# Patient Record
Sex: Female | Born: 1985 | Race: Black or African American | Hispanic: No | Marital: Single | Smoking: Current some day smoker
Health system: Southern US, Community
[De-identification: ages and names within clinical notes are randomized; demographics above are authoritative.]

## PROBLEM LIST (undated history)

## (undated) DIAGNOSIS — M549 Dorsalgia, unspecified: Secondary | ICD-10-CM

## (undated) DIAGNOSIS — G8929 Other chronic pain: Secondary | ICD-10-CM

## (undated) DIAGNOSIS — G809 Cerebral palsy, unspecified: Secondary | ICD-10-CM

## (undated) DIAGNOSIS — I10 Essential (primary) hypertension: Secondary | ICD-10-CM

---
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
## 2009-04-17 ENCOUNTER — Emergency Department: Payer: Self-pay | Admitting: Emergency Medicine

## 2009-10-19 ENCOUNTER — Ambulatory Visit: Payer: Self-pay

## 2009-10-20 ENCOUNTER — Inpatient Hospital Stay: Payer: Self-pay

## 2010-10-05 ENCOUNTER — Ambulatory Visit: Payer: Self-pay | Admitting: Family Medicine

## 2013-01-28 ENCOUNTER — Emergency Department: Payer: Self-pay | Admitting: Emergency Medicine

## 2015-04-20 ENCOUNTER — Encounter: Payer: Self-pay | Admitting: Emergency Medicine

## 2015-04-20 ENCOUNTER — Emergency Department
Admission: EM | Admit: 2015-04-20 | Discharge: 2015-04-20 | Disposition: A | Discharge status: Home / Self Care | Payer: Medicaid Other | Attending: Emergency Medicine | Admitting: Emergency Medicine

## 2015-04-20 DIAGNOSIS — I10 Essential (primary) hypertension: Secondary | ICD-10-CM | POA: Insufficient documentation

## 2015-04-20 DIAGNOSIS — R05 Cough: Secondary | ICD-10-CM

## 2015-04-20 DIAGNOSIS — R059 Cough, unspecified: Secondary | ICD-10-CM

## 2015-04-20 DIAGNOSIS — J069 Acute upper respiratory infection, unspecified: Secondary | ICD-10-CM | POA: Insufficient documentation

## 2015-04-20 DIAGNOSIS — Z79899 Other long term (current) drug therapy: Secondary | ICD-10-CM | POA: Diagnosis not present

## 2015-04-20 DIAGNOSIS — Z720 Tobacco use: Secondary | ICD-10-CM | POA: Insufficient documentation

## 2015-04-20 DIAGNOSIS — Z791 Long term (current) use of non-steroidal anti-inflammatories (NSAID): Secondary | ICD-10-CM | POA: Insufficient documentation

## 2015-04-20 DIAGNOSIS — G809 Cerebral palsy, unspecified: Secondary | ICD-10-CM | POA: Insufficient documentation

## 2015-04-20 HISTORY — DX: Dorsalgia, unspecified: M54.9

## 2015-04-20 HISTORY — DX: Other chronic pain: G89.29

## 2015-04-20 HISTORY — DX: Cerebral palsy, unspecified: G80.9

## 2015-04-20 HISTORY — DX: Essential (primary) hypertension: I10

## 2015-04-20 MED ORDER — ACETAMINOPHEN-CODEINE #3 300-30 MG PO TABS: 1.0000 | ORAL_TABLET | Freq: Four times a day (QID) | ORAL | Status: DC | PRN

## 2015-04-20 MED ORDER — PREDNISONE 10 MG PO TABS: 10.0000 mg | ORAL_TABLET | Freq: Two times a day (BID) | ORAL | Status: DC

## 2015-04-20 NOTE — ED Notes (Signed)
Pt presents with rhinorrhea and cough for three days. Denies fever. Pt complains of body aches. Pt is hoarse and losing voice. States she had the flu shot at the beginning of October.

## 2015-04-20 NOTE — Discharge Instructions (Signed)
Cough, Adult A cough helps to clear your throat and lungs. A cough may last only 2-3 weeks (acute), or it may last longer than 8 weeks (chronic). Many different things can cause a cough. A cough may be a sign of an illness or another medical condition. HOME CARE  Pay attention to any changes in your cough.  Take medicines only as told by your doctor.  If you were prescribed an antibiotic medicine, take it as told by your doctor. Do not stop taking it even if you start to feel better.  Talk with your doctor before you try using a cough medicine.  Drink enough fluid to keep your pee (urine) clear or pale yellow.  If the air is dry, use a cold steam vaporizer or humidifier in your home.  Stay away from things that make you cough at work or at home.  If your cough is worse at night, try using extra pillows to raise your head up higher while you sleep.  Do not smoke, and try not to be around smoke. If you need help quitting, ask your doctor.  Do not have caffeine.  Do not drink alcohol.  Rest as needed. GET HELP IF:  You have new problems (symptoms).  You cough up yellow fluid (pus).  Your cough does not get better after 2-3 weeks, or your cough gets worse.  Medicine does not help your cough and you are not sleeping well.  You have pain that gets worse or pain that is not helped with medicine.  You have a fever.  You are losing weight and you do not know why.  You have night sweats. GET HELP RIGHT AWAY IF:  You cough up blood.  You have trouble breathing.  Your heartbeat is very fast.   This information is not intended to replace advice given to you by your health care provider. Make sure you discuss any questions you have with your health care provider.   Document Released: 02/10/2011 Document Revised: 02/18/2015 Document Reviewed: 08/06/2014 Elsevier Interactive Patient Education 2016 [employer]   Continue to dose your daily allergy medicines. Take the  prescription meds as directed.  [facility] as needed for medical care.

## 2015-04-20 NOTE — ED Provider Notes (Signed)
[facility] Emergency Department Provider Note ____________________________________________  Time seen: 1555  I have reviewed the triage vital signs and the nursing notes.  HISTORY  Chief Complaint  Cough  HPI Dawn Frank is a 29 y.o. female reports to the ED for evaluation and treatment of a cough that she has apparently had for several weeks now. She was recently provided with a prescription for Tessalon Perles by her PCP, but denies any significant benefit with that medication. She reports an intermittently productive cough with rhinorrhea for the last 3 days. She is also noting intermittent hoarseness to her voice in the last few days. She had not had any interim fevers, chills, or sweats.  Past Medical History  Diagnosis Date  . Hypertension   . Chronic back pain     Patient Active Problem List   Diagnosis Date Noted  . Cerebral palsy (HCC) 04/20/2015    History reviewed. No pertinent past surgical history.  Current Outpatient Rx  Name  Route  Sig  Dispense  Refill  . Ascorbic Acid (VITAMIN C) 1000 MG tablet   Oral   Take 1,000 mg by mouth daily.         . cyclobenzaprine (FLEXERIL) 10 MG tablet   Oral   Take 10 mg by mouth at bedtime.         . gabapentin (NEURONTIN) 300 MG capsule   Oral   Take 900 mg by mouth 3 (three) times daily.         Marland Kitchen. lisinopril (PRINIVIL,ZESTRIL) 10 MG tablet   Oral   Take 10 mg by mouth daily.         . meloxicam (MOBIC) 15 MG tablet   Oral   Take 15 mg by mouth daily.         Marland Kitchen. acetaminophen-codeine (TYLENOL #3) 300-30 MG tablet   Oral   Take 1 tablet by mouth every 6 (six) hours as needed for moderate pain.   10 tablet   0   . predniSONE (DELTASONE) 10 MG tablet   Oral   Take 1 tablet (10 mg total) by mouth 2 (two) times daily with a meal.   10 tablet   0    Allergies Review of patient's allergies indicates no known allergies.  History reviewed. No pertinent family  history.  Social History Social History  Substance Use Topics  . Smoking status: Current Every Day Smoker  . Smokeless tobacco: None  . Alcohol Use: No   Review of Systems  Constitutional: Negative for fever. Eyes: Negative for visual changes. ENT: Negative for sore throat. Reports hoarseness Cardiovascular: Negative for chest pain. Respiratory: Negative for shortness of breath. Reports cough and congestion as above.  Gastrointestinal: Negative for abdominal pain, vomiting and diarrhea. Genitourinary: Negative for dysuria. Musculoskeletal: Negative for back pain. Skin: Negative for rash. Neurological: Negative for headaches, focal weakness or numbness. ____________________________________________  PHYSICAL EXAM:  VITAL SIGNS: ED Triage Vitals  Enc Vitals Group     BP 04/20/15 1329 160/94 mmHg     Pulse Rate 04/20/15 1329 88     Resp 04/20/15 1329 20     Temp 04/20/15 1329 98.3 F (36.8 C)     Temp Source 04/20/15 1329 Oral     SpO2 04/20/15 1329 100 %     Weight 04/20/15 1329 218 lb (98.884 kg)     Height 04/20/15 1329 5\' 2"  (1.575 m)     Head Cir --      Peak Flow --  Pain Score 04/20/15 1329 10     Pain Loc --      Pain Edu? --      Excl. in GC? --    Constitutional: Alert and oriented. Well appearing and in no distress. Head: Normocephalic and atraumatic.      Eyes: Conjunctivae are normal. PERRL. Normal extraocular movements      Ears: Canals clear. TMs intact bilaterally.   Nose: No congestion/rhinorrhea.   Mouth/Throat: Mucous membranes are moist.   Neck: Supple. No thyromegaly. Hematological/Lymphatic/Immunological: No cervical lymphadenopathy. Cardiovascular: Normal rate, regular rhythm.  Respiratory: Normal respiratory effort. No wheezes/rales/rhonchi. Gastrointestinal: Soft and nontender. No distention. Musculoskeletal: Nontender with normal range of motion in all extremities.  Neurologic:  Normal gait without ataxia. Normal speech and  language. No gross focal neurologic deficits are appreciated. Skin:  Skin is warm, dry and intact. No rash noted. Psychiatric: Mood and affect are normal. Patient exhibits appropriate insight and judgment. ____________________________________________  INITIAL IMPRESSION / ASSESSMENT AND PLAN / ED COURSE  Patient with URI symptoms with laryngitis. She will be discharged with a prescription for prednisone to dose twice daily as directed as well as acetaminophen with codeine as needed for cough suppression. She will follow with the primary care provider at [facility] clinic for ongoing symptom management. ____________________________________________  FINAL CLINICAL IMPRESSION(S) / ED DIAGNOSES  Final diagnoses:  Cough  URI (upper respiratory infection)      Lissa Hoard, PA-C 04/20/15 2347  Myrna Blazer, MD 04/25/15 [phone]

## 2015-04-20 NOTE — ED Notes (Signed)
Pt to ed with c/o cough, congestion, sore throat, bilat feet pain x 3 days.  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

## 2015-05-24 ENCOUNTER — Emergency Department
Admission: EM | Admit: 2015-05-24 | Discharge: 2015-05-24 | Disposition: A | Discharge status: Home / Self Care | Payer: Medicaid Other | Attending: Emergency Medicine | Admitting: Emergency Medicine

## 2015-05-24 ENCOUNTER — Encounter: Payer: Self-pay | Admitting: Emergency Medicine

## 2015-05-24 DIAGNOSIS — Z7952 Long term (current) use of systemic steroids: Secondary | ICD-10-CM | POA: Diagnosis not present

## 2015-05-24 DIAGNOSIS — Z79899 Other long term (current) drug therapy: Secondary | ICD-10-CM | POA: Diagnosis not present

## 2015-05-24 DIAGNOSIS — J069 Acute upper respiratory infection, unspecified: Secondary | ICD-10-CM | POA: Insufficient documentation

## 2015-05-24 DIAGNOSIS — I10 Essential (primary) hypertension: Secondary | ICD-10-CM | POA: Insufficient documentation

## 2015-05-24 DIAGNOSIS — Z791 Long term (current) use of non-steroidal anti-inflammatories (NSAID): Secondary | ICD-10-CM | POA: Insufficient documentation

## 2015-05-24 DIAGNOSIS — R05 Cough: Secondary | ICD-10-CM | POA: Diagnosis present

## 2015-05-24 DIAGNOSIS — F17210 Nicotine dependence, cigarettes, uncomplicated: Secondary | ICD-10-CM | POA: Diagnosis not present

## 2015-05-24 MED ORDER — GUAIFENESIN-CODEINE 100-10 MG/5ML PO SOLN: 10.0000 mL | ORAL | Status: DC | PRN

## 2015-05-24 MED ORDER — CHLORPHENIRAMINE MALEATE 4 MG PO TABS: 4.0000 mg | ORAL_TABLET | Freq: Two times a day (BID) | ORAL | Status: DC | PRN

## 2015-05-24 MED ORDER — AZITHROMYCIN 250 MG PO TABS
ORAL_TABLET | ORAL | Status: DC
Start: 2015-05-24 — End: 2021-11-04

## 2015-05-24 NOTE — ED Notes (Signed)
NAD noted at time of D/C. Pt denies questions or concerns. Pt ambulatory to the lobby at this time.  

## 2015-05-24 NOTE — Discharge Instructions (Signed)
Upper Respiratory Infection, Adult °Most upper respiratory infections (URIs) are a viral infection of the air passages leading to the lungs. A URI affects the nose, throat, and upper air passages. The most common type of URI is nasopharyngitis and is typically referred to as "the common cold." °URIs run their course and usually go away on their own. Most of the time, a URI does not require medical attention, but sometimes a bacterial infection in the upper airways can follow a viral infection. This is called a secondary infection. Sinus and middle ear infections are common types of secondary upper respiratory infections. °Bacterial pneumonia can also complicate a URI. A URI can worsen asthma and chronic obstructive pulmonary disease (COPD). Sometimes, these complications can require emergency medical care and may be life threatening.  °CAUSES °Almost all URIs are caused by viruses. A virus is a type of germ and can spread from one person to another.  °RISKS FACTORS °You may be at risk for a URI if:  °· You smoke.   °· You have chronic heart or lung disease. °· You have a weakened defense (immune) system.   °· You are very young or very old.   °· You have nasal allergies or asthma. °· You work in crowded or poorly ventilated areas. °· You work in health care facilities or schools. °SIGNS AND SYMPTOMS  °Symptoms typically develop 2-3 days after you come in contact with a cold virus. Most viral URIs last 7-10 days. However, viral URIs from the influenza virus (flu virus) can last 14-18 days and are typically more severe. Symptoms may include:  °· Runny or stuffy (congested) nose.   °· Sneezing.   °· Cough.   °· Sore throat.   °· Headache.   °· Fatigue.   °· Fever.   °· Loss of appetite.   °· Pain in your forehead, behind your eyes, and over your cheekbones (sinus pain). °· Muscle aches.   °DIAGNOSIS  °Your health care provider may diagnose a URI by: °· Physical exam. °· Tests to check that your symptoms are not due to  another condition such as: °· Strep throat. °· Sinusitis. °· Pneumonia. °· Asthma. °TREATMENT  °A URI goes away on its own with time. It cannot be cured with medicines, but medicines may be prescribed or recommended to relieve symptoms. Medicines may help: °· Reduce your fever. °· Reduce your cough. °· Relieve nasal congestion. °HOME CARE INSTRUCTIONS  °· Take medicines only as directed by your health care provider.   °· Gargle warm saltwater or take cough drops to comfort your throat as directed by your health care provider. °· Use a warm mist humidifier or inhale steam from a shower to increase air moisture. This may make it easier to breathe. °· Drink enough fluid to keep your urine clear or pale yellow.   °· Eat soups and other clear broths and maintain good nutrition.   °· Rest as needed.   °· Return to work when your temperature has returned to normal or as your health care provider advises. You may need to stay home longer to avoid infecting others. You can also use a face mask and careful hand washing to prevent spread of the virus. °· Increase the usage of your inhaler if you have asthma.   °· Do not use any tobacco products, including cigarettes, chewing tobacco, or electronic cigarettes. If you need help quitting, ask your health care provider. °PREVENTION  °The best way to protect yourself from getting a cold is to practice good hygiene.  °· Avoid oral or hand contact with people with cold   symptoms.   °· Wash your hands often if contact occurs.   °There is no clear evidence that vitamin C, vitamin E, echinacea, or exercise reduces the chance of developing a cold. However, it is always recommended to get plenty of rest, exercise, and practice good nutrition.  °SEEK MEDICAL CARE IF:  °· You are getting worse rather than better.   °· Your symptoms are not controlled by medicine.   °· You have chills. °· You have worsening shortness of breath. °· You have brown or red mucus. °· You have yellow or brown nasal  discharge. °· You have pain in your face, especially when you bend forward. °· You have a fever. °· You have swollen neck glands. °· You have pain while swallowing. °· You have white areas in the back of your throat. °SEEK IMMEDIATE MEDICAL CARE IF:  °· You have severe or persistent: °¨ Headache. °¨ Ear pain. °¨ Sinus pain. °¨ Chest pain. °· You have chronic lung disease and any of the following: °¨ Wheezing. °¨ Prolonged cough. °¨ Coughing up blood. °¨ A change in your usual mucus. °· You have a stiff neck. °· You have changes in your: °¨ Vision. °¨ Hearing. °¨ Thinking. °¨ Mood. °MAKE SURE YOU:  °· Understand these instructions. °· Will watch your condition. °· Will get help right away if you are not doing well or get worse. °  °This information is not intended to replace advice given to you by your health care provider. Make sure you discuss any questions you have with your health care provider. °  °Document Released: 11/23/2000 Document Revised: 10/14/2014 Document Reviewed: 09/04/2013 °Elsevier Interactive Patient Education ©2016 [employer] Inc. ° °Cough, Adult °Coughing is a reflex that clears your throat and your airways. Coughing helps to heal and protect your lungs. It is normal to cough occasionally, but a cough that happens with other symptoms or lasts a long time may be a sign of a condition that needs treatment. A cough may last only 2-3 weeks (acute), or it may last longer than 8 weeks (chronic). °CAUSES °Coughing is commonly caused by: °· Breathing in substances that irritate your lungs. °· A viral or bacterial respiratory infection. °· Allergies. °· Asthma. °· Postnasal drip. °· Smoking. °· Acid backing up from the stomach into the esophagus (gastroesophageal reflux). °· Certain medicines. °· Chronic lung problems, including COPD (or rarely, lung cancer). °· Other medical conditions such as heart failure. °HOME CARE INSTRUCTIONS  °Pay attention to any changes in your symptoms. Take these actions to  help with your discomfort: °· Take medicines only as told by your health care provider. °¨ If you were prescribed an antibiotic medicine, take it as told by your health care provider. Do not stop taking the antibiotic even if you start to feel better. °¨ Talk with your health care provider before you take a cough suppressant medicine. °· Drink enough fluid to keep your urine clear or pale yellow. °· If the air is dry, use a cold steam vaporizer or humidifier in your bedroom or your home to help loosen secretions. °· Avoid anything that causes you to cough at work or at home. °· If your cough is worse at night, try sleeping in a semi-upright position. °· Avoid cigarette smoke. If you smoke, quit smoking. If you need help quitting, ask your health care provider. °· Avoid caffeine. °· Avoid alcohol. °· Rest as needed. °SEEK MEDICAL CARE IF:  °· You have new symptoms. °· You cough up pus. °· Your cough   does not get better after 2-3 weeks, or your cough gets worse. °· You cannot control your cough with suppressant medicines and you are losing sleep. °· You develop pain that is getting worse or pain that is not controlled with pain medicines. °· You have a fever. °· You have unexplained weight loss. °· You have night sweats. °SEEK IMMEDIATE MEDICAL CARE IF: °· You cough up blood. °· You have difficulty breathing. °· Your heartbeat is very fast. °  °This information is not intended to replace advice given to you by your health care provider. Make sure you discuss any questions you have with your health care provider. °  °Document Released: 11/26/2010 Document Revised: 02/18/2015 Document Reviewed: 08/06/2014 °Elsevier Interactive Patient Education ©2016 Elsevier Inc. ° °

## 2015-05-24 NOTE — ED Notes (Signed)
Pt reports "deep coughs" for the last 2-3 days. Pt states she has been unable to sleep due to coughing and also reports HA. Presents with dry cough to ED.

## 2015-05-24 NOTE — ED Provider Notes (Signed)
[facility] Emergency Department Provider Note  ____________________________________________  Time seen: Approximately 10:44 AM  I have reviewed the triage vital signs and the nursing notes.   HISTORY  Chief Complaint Cough and Headache   HPI Meda KlinefelterShamila D Sudano is a 29 y.o. female who presents for evaluation of cough congestion and drainage. Patient states symptoms started 3 days ago unable supinate secondary to cough states that she woke up sweating this morning unsure of fever chills   Past Medical History  Diagnosis Date  . Hypertension   . Chronic back pain   . Cerebral palsy [facility](HCC)     Patient Active Problem List   Diagnosis Date Noted  . Cerebral palsy (HCC)     History reviewed. No pertinent past surgical history.  Current Outpatient Rx  Name  Route  Sig  Dispense  Refill  . acetaminophen-codeine (TYLENOL #3) 300-30 MG tablet   Oral   Take 1 tablet by mouth every 6 (six) hours as needed for moderate pain.   10 tablet   0   . Ascorbic Acid (VITAMIN C) 1000 MG tablet   Oral   Take 1,000 mg by mouth daily.         Marland Kitchen. azithromycin (ZITHROMAX Z-PAK) 250 MG tablet      Take 2 tablets (500 mg) on  Day 1,  followed by 1 tablet (250 mg) once daily on Days 2 through 5.   6 each   0   . chlorpheniramine (CHLOR-TRIMETON) 4 MG tablet   Oral   Take 1 tablet (4 mg total) by mouth 2 (two) times daily as needed for allergies or rhinitis.   30 tablet   0   . cyclobenzaprine (FLEXERIL) 10 MG tablet   Oral   Take 10 mg by mouth at bedtime.         . gabapentin (NEURONTIN) 300 MG capsule   Oral   Take 900 mg by mouth 3 (three) times daily.         Marland Kitchen. guaiFENesin-codeine 100-10 MG/5ML syrup   Oral   Take 10 mLs by mouth every 4 (four) hours as needed for cough.   180 mL   0   . lisinopril (PRINIVIL,ZESTRIL) 10 MG tablet   Oral   Take 10 mg by mouth daily.         . meloxicam (MOBIC) 15 MG tablet   Oral   Take 15 mg by mouth  daily.         . predniSONE (DELTASONE) 10 MG tablet   Oral   Take 1 tablet (10 mg total) by mouth 2 (two) times daily with a meal.   10 tablet   0     Allergies Review of patient's allergies indicates no known allergies.  History reviewed. No pertinent family history.  Social History Social History  Substance Use Topics  . Smoking status: Current Some Day Smoker    Types: Cigarettes  . Smokeless tobacco: Never Used  . Alcohol Use: No    Review of Systems Constitutional: No fever/chills Eyes: No visual changes. ENT: Positive sore throat secondary to coughing Cardiovascular: Denies chest pain. Respiratory: Denies shortness of breath. Positive for coughing Gastrointestinal: No abdominal pain.  No nausea, no vomiting.  No diarrhea.  No constipation. Genitourinary: Negative for dysuria. Musculoskeletal: Negative for back pain. Skin: Negative for rash. Neurological: Negative for headaches, focal weakness or numbness.  10-point ROS otherwise negative.  ____________________________________________   PHYSICAL EXAM:  VITAL SIGNS: ED Triage Vitals  Enc Vitals Group     BP 05/24/15 1018 132/102 mmHg     Pulse Rate 05/24/15 1018 87     Resp 05/24/15 1018 20     Temp 05/24/15 1018 98 F (36.7 C)     Temp Source 05/24/15 1018 Oral     SpO2 05/24/15 1018 99 %     Weight --      Height --      Head Cir --      Peak Flow --      Pain Score 05/24/15 1031 10     Pain Loc --      Pain Edu? --      Excl. in GC? --     Constitutional: Alert and oriented. Well appearing and in no acute distress. Eyes: Conjunctivae are normal. PERRL. EOMI. Head: Atraumatic. Nose: Positive for congestion/rhinnorhea. Mouth/Throat: Mucous membranes are moist.  Oropharynx non-erythematous. Neck: No stridor.   Cardiovascular: Normal rate, regular rhythm. Grossly normal heart sounds.  Good peripheral circulation. Respiratory: Normal respiratory effort.  No retractions. Lungs  CTAB. Musculoskeletal: No lower extremity tenderness nor edema.  No joint effusions. Neurologic:  Normal speech and language. No gross focal neurologic deficits are appreciated. No gait instability. Skin:  Skin is warm, dry and intact. No rash noted. Psychiatric: Mood and affect are normal. Speech and behavior are normal.  ____________________________________________   LABS (all labs ordered are listed, but only abnormal results are displayed)  Labs Reviewed - No data to display ____________________________________________   PROCEDURES  Procedure(s) performed: None  Critical Care performed: No  ____________________________________________   INITIAL IMPRESSION / ASSESSMENT AND PLAN / ED COURSE  Pertinent labs & imaging results that were available during my care of the patient were reviewed by me and considered in my medical decision making (see chart for details).  Acute URI. Rx given for Z-Pak, Robitussin-AC and chlorpheniramine for drainage. Patient to follow up PCP or return to the ER with any worsening symptomology. Patient voices no other emergency medical complaints at this visit. ____________________________________________   FINAL CLINICAL IMPRESSION(S) / ED DIAGNOSES  Final diagnoses:  URI, acute      Evangeline Dakin, PA-C 05/24/15 1054  Phineas Semen, MD 05/24/15 1329

## 2015-08-15 ENCOUNTER — Emergency Department
Admission: EM | Admit: 2015-08-15 | Discharge: 2015-08-15 | Disposition: A | Discharge status: Home / Self Care | Payer: Medicare Other | Attending: Student | Admitting: Student

## 2015-08-15 ENCOUNTER — Emergency Department: Payer: Medicare Other

## 2015-08-15 DIAGNOSIS — Z79899 Other long term (current) drug therapy: Secondary | ICD-10-CM | POA: Diagnosis not present

## 2015-08-15 DIAGNOSIS — M545 Low back pain, unspecified: Secondary | ICD-10-CM

## 2015-08-15 DIAGNOSIS — F17210 Nicotine dependence, cigarettes, uncomplicated: Secondary | ICD-10-CM | POA: Diagnosis not present

## 2015-08-15 DIAGNOSIS — I10 Essential (primary) hypertension: Secondary | ICD-10-CM | POA: Insufficient documentation

## 2015-08-15 DIAGNOSIS — J069 Acute upper respiratory infection, unspecified: Secondary | ICD-10-CM | POA: Insufficient documentation

## 2015-08-15 DIAGNOSIS — Z3202 Encounter for pregnancy test, result negative: Secondary | ICD-10-CM | POA: Diagnosis not present

## 2015-08-15 DIAGNOSIS — Z792 Long term (current) use of antibiotics: Secondary | ICD-10-CM | POA: Diagnosis not present

## 2015-08-15 LAB — URINALYSIS COMPLETE WITH MICROSCOPIC (ARMC ONLY)
BILIRUBIN URINE: NEGATIVE
GLUCOSE, UA: NEGATIVE mg/dL
Hgb urine dipstick: NEGATIVE
KETONES UR: NEGATIVE mg/dL
Leukocytes, UA: NEGATIVE
NITRITE: NEGATIVE
Protein, ur: NEGATIVE mg/dL
SPECIFIC GRAVITY, URINE: 1.017 (ref 1.005–1.030)
pH: 6 (ref 5.0–8.0)

## 2015-08-15 LAB — POCT PREGNANCY, URINE: Preg Test, Ur: NEGATIVE

## 2015-08-15 MED ORDER — NAPROXEN 500 MG PO TABS: 500.0000 mg | ORAL_TABLET | Freq: Two times a day (BID) | ORAL | Status: AC

## 2015-08-15 MED ORDER — KETOROLAC TROMETHAMINE 60 MG/2ML IM SOLN
60.0000 mg | Freq: Once | INTRAMUSCULAR | Status: AC
  Administered 2015-08-15: 60 mg via INTRAMUSCULAR
  Filled 2015-08-15: qty 2

## 2015-08-15 MED ORDER — GUAIFENESIN-CODEINE 100-10 MG/5ML PO SOLN: 10.0000 mL | ORAL | Status: DC | PRN

## 2015-08-15 MED ORDER — PSEUDOEPHEDRINE HCL 60 MG PO TABS: 60.0000 mg | ORAL_TABLET | ORAL | Status: DC | PRN

## 2015-08-15 MED ORDER — HYDROCODONE-ACETAMINOPHEN 5-325 MG PO TABS: 1.0000 | ORAL_TABLET | ORAL | Status: DC | PRN

## 2015-08-15 NOTE — Discharge Instructions (Signed)
Back Pain, Adult °Back pain is very common in adults. The cause of back pain is rarely dangerous and the pain often gets better over time. The cause of your back pain may not be known. Some common causes of back pain include: °· Strain of the muscles or ligaments supporting the spine. °· Wear and tear (degeneration) of the spinal disks. °· Arthritis. °· Direct injury to the back. °For many people, back pain may return. Since back pain is rarely dangerous, most people can learn to manage this condition on their own. °HOME CARE INSTRUCTIONS °Watch your back pain for any changes. The following actions may help to lessen any discomfort you are feeling: °· Remain active. It is stressful on your back to sit or stand in one place for long periods of time. Do not sit, drive, or stand in one place for more than 30 minutes at a time. Take short walks on even surfaces as soon as you are able. Try to increase the length of time you walk each day. °· Exercise regularly as directed by your health care provider. Exercise helps your back heal faster. It also helps avoid future injury by keeping your muscles strong and flexible. °· Do not stay in bed. Resting more than 1-2 days can delay your recovery. °· Pay attention to your body when you bend and lift. The most comfortable positions are those that put less stress on your recovering back. Always use proper lifting techniques, including: °· Bending your knees. °· Keeping the load close to your body. °· Avoiding twisting. °· Find a comfortable position to sleep. Use a firm mattress and lie on your side with your knees slightly bent. If you lie on your back, put a pillow under your knees. °· Avoid feeling anxious or stressed. Stress increases muscle tension and can worsen back pain. It is important to recognize when you are anxious or stressed and learn ways to manage it, such as with exercise. °· Take medicines only as directed by your health care provider. Over-the-counter  medicines to reduce pain and inflammation are often the most helpful. Your health care provider may prescribe muscle relaxant drugs. These medicines help dull your pain so you can more quickly return to your normal activities and healthy exercise. °· Apply ice to the injured area: °· Put ice in a plastic bag. °· Place a towel between your skin and the bag. °· Leave the ice on for 20 minutes, 2-3 times a day for the first 2-3 days. After that, ice and heat may be alternated to reduce pain and spasms. °· Maintain a healthy weight. Excess weight puts extra stress on your back and makes it difficult to maintain good posture. °SEEK MEDICAL CARE IF: °· You have pain that is not relieved with rest or medicine. °· You have increasing pain going down into the legs or buttocks. °· You have pain that does not improve in one week. °· You have night pain. °· You lose weight. °· You have a fever or chills. °SEEK IMMEDIATE MEDICAL CARE IF:  °· You develop new bowel or bladder control problems. °· You have unusual weakness or numbness in your arms or legs. °· You develop nausea or vomiting. °· You develop abdominal pain. °· You feel faint. °  °This information is not intended to replace advice given to you by your health care provider. Make sure you discuss any questions you have with your health care provider. °  °Document Released: 05/30/2005 Document Revised: 06/20/2014 Document Reviewed: 10/01/2013 °Elsevier Interactive Patient Education ©2016 Elsevier   Inc.  Chronic Back Pain  When back pain lasts longer than 3 months, it is called chronic back pain.People with chronic back pain often go through certain periods that are more intense (flare-ups).  CAUSES Chronic back pain can be caused by wear and tear (degeneration) on different structures in your back. These structures include:  The bones of your spine (vertebrae) and the joints surrounding your spinal cord and nerve roots (facets).  The strong, fibrous tissues that  connect your vertebrae (ligaments). Degeneration of these structures may result in pressure on your nerves. This can lead to constant pain. HOME CARE INSTRUCTIONS  Avoid bending, heavy lifting, prolonged sitting, and activities which make the problem worse.  Take brief periods of rest throughout the day to reduce your pain. Lying down or standing usually is better than sitting while you are resting.  Take over-the-counter or prescription medicines only as directed by your caregiver. SEEK IMMEDIATE MEDICAL CARE IF:   You have weakness or numbness in one of your legs or feet.  You have trouble controlling your bladder or bowels.  You have nausea, vomiting, abdominal pain, shortness of breath, or fainting.   This information is not intended to replace advice given to you by your health care provider. Make sure you discuss any questions you have with your health care provider.   Document Released: 07/07/2004 Document Revised: 08/22/2011 Document Reviewed: 11/17/2014 Elsevier Interactive Patient Education 2016 Elsevier Inc.  [employer] Therapy Heat therapy can help ease sore, stiff, injured, and tight muscles and joints. Heat relaxes your muscles, which may help ease your pain.  RISKS AND COMPLICATIONS If you have any of the following conditions, do not use heat therapy unless your health care provider has approved:  Poor circulation.  Healing wounds or scarred skin in the area being treated.  Diabetes, heart disease, or high blood pressure.  Not being able to feel (numbness) the area being treated.  Unusual swelling of the area being treated.  Active infections.  Blood clots.  Cancer.  Inability to communicate pain. This may include young children and people who have problems with their brain function (dementia).  Pregnancy. Heat therapy should only be used on old, pre-existing, or long-lasting (chronic) injuries. Do not use heat therapy on new injuries unless directed by your  health care provider. HOW TO USE HEAT THERAPY There are several different kinds of heat therapy, including:  Moist heat pack.  Warm water bath.  Hot water bottle.  Electric heating pad.  Heated gel pack.  Heated wrap.  Electric heating pad. Use the heat therapy method suggested by your health care provider. Follow your health care provider's instructions on when and how to use heat therapy. GENERAL HEAT THERAPY RECOMMENDATIONS  Do not sleep while using heat therapy. Only use heat therapy while you are awake.  Your skin may turn pink while using heat therapy. Do not use heat therapy if your skin turns red.  Do not use heat therapy if you have new pain.  High heat or long exposure to heat can cause burns. Be careful when using heat therapy to avoid burning your skin.  Do not use heat therapy on areas of your skin that are already irritated, such as with a rash or sunburn. SEEK MEDICAL CARE IF:  You have blisters, redness, swelling, or numbness.  You have new pain.  Your pain is worse. MAKE SURE YOU:  Understand these instructions.  Will watch your condition.  Will get help right away if you are  not doing well or get worse.   This information is not intended to replace advice given to you by your health care provider. Make sure you discuss any questions you have with your health care provider.   Document Released: 08/22/2011 Document Revised: 06/20/2014 Document Reviewed: 07/23/2013 Elsevier Interactive Patient Education 2016 Elsevier Inc.  Upper Respiratory Infection, Adult Most upper respiratory infections (URIs) are caused by a virus. A URI affects the nose, throat, and upper air passages. The most common type of URI is often called "the common cold." HOME CARE   Take medicines only as told by your doctor.  Gargle warm saltwater or take cough drops to comfort your throat as told by your doctor.  Use a warm mist humidifier or inhale steam from a shower to  increase air moisture. This may make it easier to breathe.  Drink enough fluid to keep your pee (urine) clear or pale yellow.  Eat soups and other clear broths.  Have a healthy diet.  Rest as needed.  Go back to work when your fever is gone or your doctor says it is okay.  You may need to stay home longer to avoid giving your URI to others.  You can also wear a face mask and wash your hands often to prevent spread of the virus.  Use your inhaler more if you have asthma.  Do not use any tobacco products, including cigarettes, chewing tobacco, or electronic cigarettes. If you need help quitting, ask your doctor. GET HELP IF:  You are getting worse, not better.  Your symptoms are not helped by medicine.  You have chills.  You are getting more short of breath.  You have brown or red mucus.  You have yellow or brown discharge from your nose.  You have pain in your face, especially when you bend forward.  You have a fever.  You have puffy (swollen) neck glands.  You have pain while swallowing.  You have white areas in the back of your throat. GET HELP RIGHT AWAY IF:   You have very bad or constant:  Headache.  Ear pain.  Pain in your forehead, behind your eyes, and over your cheekbones (sinus pain).  Chest pain.  You have long-lasting (chronic) lung disease and any of the following:  Wheezing.  Long-lasting cough.  Coughing up blood.  A change in your usual mucus.  You have a stiff neck.  You have changes in your:  Vision.  Hearing.  Thinking.  Mood. MAKE SURE YOU:   Understand these instructions.  Will watch your condition.  Will get help right away if you are not doing well or get worse.   This information is not intended to replace advice given to you by your health care provider. Make sure you discuss any questions you have with your health care provider.   Document Released: 11/16/2007 Document Revised: 10/14/2014 Document Reviewed:  09/04/2013 Elsevier Interactive Patient Education [employer]

## 2015-08-15 NOTE — ED Notes (Signed)
Discussed discharge instructions, prescriptions, and follow-up care with patient. No questions or concerns at this time. Pt stable at discharge.  

## 2015-08-15 NOTE — ED Notes (Signed)
[facility] on 3/2 and prescribed flexeril. States that pain was worse this AM and could not go to work, requesting work note at this time.

## 2015-08-15 NOTE — ED Notes (Signed)
Pt arrives to ER via POV c/o cough, congestin, and back spasms X 3 days. Pt alert and oriented X4, active, cooperative, pt in NAD. RR even and unlabored, color WNL.   Pt has not seen PCP.

## 2015-08-15 NOTE — ED Provider Notes (Signed)
[facility] Emergency Department Provider Note  ____________________________________________  Time seen: Approximately 12:52 PM  I have reviewed the triage vital signs and the nursing notes.   HISTORY  Chief Complaint URI and Back Pain    HPI Dawn Frank is a [age] y.o. female presents with multiple complaints. Patient reports that [facility] for continuous back pain. Treated with Flexeril on March 2. Patient is a note releasing her supple back to work states that she continues to have back pain. In addition patient is complaining of sinus congestion drainage and cough.   Past Medical History  Diagnosis Date  . Hypertension   . Chronic back pain   . Cerebral palsy [facility](HCC)     Patient Active Problem List   Diagnosis Date Noted  . Cerebral palsy (HCC)     History reviewed. No pertinent past surgical history.  Current Outpatient Rx  Name  Route  Sig  Dispense  Refill  . Ascorbic Acid (VITAMIN C) 1000 MG tablet   Oral   Take 1,000 mg by mouth daily.         Marland Kitchen. azithromycin (ZITHROMAX Z-PAK) 250 MG tablet      Take 2 tablets (500 mg) on  Day 1,  followed by 1 tablet (250 mg) once daily on Days 2 through 5.   6 each   0   . cyclobenzaprine (FLEXERIL) 10 MG tablet   Oral   Take 10 mg by mouth at bedtime.         Marland Kitchen. guaiFENesin-codeine 100-10 MG/5ML syrup   Oral   Take 10 mLs by mouth every 4 (four) hours as needed for cough.   180 mL   0   . HYDROcodone-acetaminophen (NORCO) 5-325 MG tablet   Oral   Take 1-2 tablets by mouth every 4 (four) hours as needed for moderate pain.   10 tablet   0   . lisinopril (PRINIVIL,ZESTRIL) 10 MG tablet   Oral   Take 10 mg by mouth daily.         . naproxen (NAPROSYN) 500 MG tablet   Oral   Take 1 tablet (500 mg total) by mouth 2 (two) times daily with a meal.   60 tablet   0     Allergies Review of patient's allergies indicates no known allergies.  No  family history on file.  Social History Social History  Substance Use Topics  . Smoking status: Current Some Day Smoker    Types: Cigarettes  . Smokeless tobacco: Never Used  . Alcohol Use: No    Review of Systems Constitutional: No fever/chills Eyes: No visual changes. ENT: No sore throat. Positive for nasal congestion Cardiovascular: Denies chest pain. Respiratory: Denies shortness of breath. Positive for cough Gastrointestinal: No abdominal pain.  No nausea, no vomiting.  No diarrhea.  No constipation. Genitourinary: Negative for dysuria. Musculoskeletal: Positive for back pain. Skin: Negative for rash. Neurological: Negative for headaches, focal weakness or numbness.  10-point ROS otherwise negative.  ____________________________________________   PHYSICAL EXAM:  VITAL SIGNS: ED Triage Vitals  Enc Vitals Group     BP 08/15/15 1210 140/94 mmHg     Pulse Rate 08/15/15 1210 97     Resp 08/15/15 1210 18     Temp 08/15/15 1210 98.1 F (36.7 C)     Temp Source 08/15/15 1210 Axillary     SpO2 08/15/15 1210 100 %     Weight 08/15/15 1210 211 lb (95.709  kg)     Height 08/15/15 1210  (1.575 m)     Head Cir --      Peak Flow --      Pain Score 08/15/15 1211 8     Pain Loc --      Pain Edu? --      Excl. in GC? --     Constitutional: Alert and oriented. Well appearing and in no acute distress. Eyes: Conjunctivae are normal. PERRL. EOMI. Nose: Minimal congestion/rhinnorhea. Mouth/Throat: Mucous membranes are moist.  Oropharynx non-erythematous. Neck: No stridor. Full range of motion nontender   Cardiovascular: Normal rate, regular rhythm. Grossly normal heart sounds.  Good peripheral circulation. Respiratory: Normal respiratory effort.  No retractions. Lungs CTAB. Musculoskeletal: No lower extremity tenderness nor edema.  No joint effusions. Straight leg raise negative bilaterally. Distally neurovascularly intact. Neurologic:  Normal speech and language. No gross  focal neurologic deficits are appreciated. No gait instability. Skin:  Skin is warm, dry and intact. No rash noted. Psychiatric: Mood and affect are normal. Speech and behavior are normal.  ____________________________________________   LABS (all labs ordered are listed, but only abnormal results are displayed)  Labs Reviewed  URINALYSIS COMPLETEWITH MICROSCOPIC ([facility] ONLY) - Abnormal; Notable for the following:    Color, Urine YELLOW (*)    APPearance HAZY (*)    Bacteria, UA RARE (*)    Squamous Epithelial / LPF 6-30 (*)    All other components within normal limits  POC URINE PREG, ED  POCT PREGNANCY, URINE     PROCEDURES  Procedure(s) performed: None  Critical Care performed: No  ____________________________________________   INITIAL IMPRESSION / ASSESSMENT AND PLAN / ED COURSE  Pertinent labs & imaging results that were available during my care of the patient were reviewed by me and considered in my medical decision making (see chart for details).  Recurrent low back pain. URI. Rx given for Naprosyn 500 mg twice a day, Robitussin-AC as needed for cough and congestion and Vicodin No. 8 as needed for severe back pain. Patient to follow-up with her neurosurgeon as scheduled and MRI to be done at his or her discretion. ____________________________________________   FINAL CLINICAL IMPRESSION(S) / ED DIAGNOSES  Final diagnoses:  Midline low back pain without sciatica  URI, acute     This chart was dictated using voice recognition software/Dragon. Despite best efforts to proofread, errors can occur which can change the meaning. Any change was purely unintentional.   Evangeline Dakin, PA-C 08/15/15 1427  Gayla Doss, MD 08/15/15 [id number]

## 2015-08-20 DIAGNOSIS — G808 Other cerebral palsy: Secondary | ICD-10-CM | POA: Insufficient documentation

## 2016-05-12 ENCOUNTER — Emergency Department: Payer: Medicare Other

## 2016-05-12 ENCOUNTER — Emergency Department
Admission: EM | Admit: 2016-05-12 | Discharge: 2016-05-12 | Disposition: A | Discharge status: Home / Self Care | Payer: Medicare Other | Attending: Emergency Medicine | Admitting: Emergency Medicine

## 2016-05-12 ENCOUNTER — Encounter: Payer: Self-pay | Admitting: Emergency Medicine

## 2016-05-12 DIAGNOSIS — R1011 Right upper quadrant pain: Secondary | ICD-10-CM | POA: Insufficient documentation

## 2016-05-12 DIAGNOSIS — G809 Cerebral palsy, unspecified: Secondary | ICD-10-CM | POA: Insufficient documentation

## 2016-05-12 DIAGNOSIS — F17210 Nicotine dependence, cigarettes, uncomplicated: Secondary | ICD-10-CM | POA: Diagnosis not present

## 2016-05-12 DIAGNOSIS — R059 Cough, unspecified: Secondary | ICD-10-CM

## 2016-05-12 DIAGNOSIS — R197 Diarrhea, unspecified: Secondary | ICD-10-CM | POA: Insufficient documentation

## 2016-05-12 DIAGNOSIS — I10 Essential (primary) hypertension: Secondary | ICD-10-CM | POA: Diagnosis not present

## 2016-05-12 DIAGNOSIS — Z79899 Other long term (current) drug therapy: Secondary | ICD-10-CM | POA: Diagnosis not present

## 2016-05-12 DIAGNOSIS — R109 Unspecified abdominal pain: Secondary | ICD-10-CM

## 2016-05-12 DIAGNOSIS — R05 Cough: Secondary | ICD-10-CM | POA: Diagnosis not present

## 2016-05-12 DIAGNOSIS — R112 Nausea with vomiting, unspecified: Secondary | ICD-10-CM | POA: Diagnosis present

## 2016-05-12 LAB — CBC
HEMATOCRIT: 37.6 % (ref 35.0–47.0)
HEMOGLOBIN: 12 g/dL (ref 12.0–16.0)
MCH: 24.7 pg — AB (ref 26.0–34.0)
MCHC: 31.9 g/dL — AB (ref 32.0–36.0)
MCV: 77.3 fL — ABNORMAL LOW (ref 80.0–100.0)
Platelets: 231 10*3/uL (ref 150–440)
RBC: 4.86 MIL/uL (ref 3.80–5.20)
RDW: 17.2 % — AB (ref 11.5–14.5)
WBC: 6.4 10*3/uL (ref 3.6–11.0)

## 2016-05-12 LAB — URINALYSIS COMPLETE WITH MICROSCOPIC (ARMC ONLY)
BACTERIA UA: NONE SEEN
Bilirubin Urine: NEGATIVE
GLUCOSE, UA: NEGATIVE mg/dL
Ketones, ur: NEGATIVE mg/dL
Leukocytes, UA: NEGATIVE
NITRITE: NEGATIVE
PROTEIN: NEGATIVE mg/dL
SPECIFIC GRAVITY, URINE: 1.013 (ref 1.005–1.030)
WBC UA: NONE SEEN WBC/hpf (ref 0–5)
pH: 7 (ref 5.0–8.0)

## 2016-05-12 LAB — COMPREHENSIVE METABOLIC PANEL
ALBUMIN: 3.6 g/dL (ref 3.5–5.0)
ALK PHOS: 77 U/L (ref 38–126)
ALT: 32 U/L (ref 14–54)
AST: 34 U/L (ref 15–41)
Anion gap: 6 (ref 5–15)
BILIRUBIN TOTAL: 0.3 mg/dL (ref 0.3–1.2)
CO2: 24 mmol/L (ref 22–32)
Calcium: 9.1 mg/dL (ref 8.9–10.3)
Chloride: 107 mmol/L (ref 101–111)
Creatinine, Ser: 0.77 mg/dL (ref 0.44–1.00)
GFR calc Af Amer: >60 mL/min (ref >60)
GFR calc non Af Amer: >60 mL/min (ref >60)
GLUCOSE: 165 mg/dL — AB (ref 65–99)
POTASSIUM: 3.4 mmol/L — AB (ref 3.5–5.1)
SODIUM: 137 mmol/L (ref 135–145)
TOTAL PROTEIN: 6.6 g/dL (ref 6.5–8.1)

## 2016-05-12 LAB — LIPASE, BLOOD: Lipase: 28 U/L (ref 11–51)

## 2016-05-12 LAB — PREGNANCY, URINE: PREG TEST UR: NEGATIVE

## 2016-05-12 MED ORDER — ONDANSETRON 4 MG PO TBDP
4.0000 mg | ORAL_TABLET | Freq: Once | ORAL | Status: AC
  Administered 2016-05-12: 4 mg via ORAL

## 2016-05-12 MED ORDER — HYDROCOD POLST-CPM POLST ER 10-8 MG/5ML PO SUER: 5.0000 mL | Freq: Two times a day (BID) | ORAL | 0 refills | Status: DC

## 2016-05-12 MED ORDER — OXYCODONE-ACETAMINOPHEN 5-325 MG PO TABS
2.0000 | ORAL_TABLET | Freq: Once | ORAL | Status: AC
  Administered 2016-05-12: 2 via ORAL

## 2016-05-12 MED ORDER — ONDANSETRON HCL 4 MG PO TABS: 4.0000 mg | ORAL_TABLET | Freq: Every day | ORAL | 1 refills | Status: AC | PRN

## 2016-05-12 NOTE — ED Notes (Signed)
Pt at [facility]

## 2016-05-12 NOTE — ED Provider Notes (Signed)
[facility] Emergency Department Provider Note        Time seen: ----------------------------------------- 2:50 PM on 05/12/2016 -----------------------------------------    I have reviewed the triage vital signs and the nursing notes.   HISTORY  Chief Complaint Abdominal Pain    HPI Dawn Frank is a 30 y.o. female who presents the ER for abdominal pain with vomiting and diarrhea. Patient states pain started about 2 days ago, nothing makes it out or worse. Pain is 10 out of 10, she has not had the pain before. She denies fever, vomiting or diarrhea. Pain does not seem to be affected by eating.She was recently told by her doctor that she had pneumonia.   Past Medical History:  Diagnosis Date  . Cerebral palsy (HCC)   . Chronic back pain   . Hypertension     Patient Active Problem List   Diagnosis Date Noted  . Cerebral palsy (HCC)     History reviewed. No pertinent surgical history.  Allergies Patient has no known allergies.  Social History Social History  Substance Use Topics  . Smoking status: Current Some Day Smoker    Types: Cigarettes  . Smokeless tobacco: Never Used  . Alcohol use No    Review of Systems Constitutional: Negative for fever. Cardiovascular: Negative for chest pain. Respiratory: Negative for shortness of breath.Positive for cough Gastrointestinal: Positive for abdominal pain Genitourinary: Negative for dysuria. Musculoskeletal: Negative for back pain. Skin: Negative for rash. Neurological: Negative for headaches, focal weakness or numbness.  10-point ROS otherwise negative.  ____________________________________________   PHYSICAL EXAM:  VITAL SIGNS: ED Triage Vitals  Enc Vitals Group     BP 05/12/16 1118 133/90     Pulse Rate 05/12/16 1118 100     Resp 05/12/16 1118 18     Temp 05/12/16 1118 98.7 F (37.1 C)     Temp Source 05/12/16 1118 Oral     SpO2 05/12/16 1118 100 %     Weight 05/12/16  1118 205 lb (93 kg)     Height 05/12/16 1118 5\' 2"  (1.575 m)     Head Circumference --      Peak Flow --      Pain Score 05/12/16 1119 10     Pain Loc --      Pain Edu? --      Excl. in GC? --     Constitutional: Alert and oriented. Well appearing and in no distress. Eyes: Conjunctivae are normal. PERRL. Normal extraocular movements. ENT   Head: Normocephalic and atraumatic.   Nose: No congestion/rhinnorhea.   Mouth/Throat: Mucous membranes are moist.   Neck: No stridor. Cardiovascular: Normal rate, regular rhythm. No murmurs, rubs, or gallops. Respiratory: Normal respiratory effort without tachypnea nor retractions. Breath sounds are clear and equal bilaterally. No wheezes/rales/rhonchi. Gastrointestinal: Right upper quadrant tenderness, no rebound or guarding. Normal bowel sounds. Musculoskeletal: Nontender with normal range of motion in all extremities. No lower extremity tenderness nor edema. Neurologic:  Normal speech and language. No gross focal neurologic deficits are appreciated.  Skin:  Skin is warm, dry and intact. Small cystlike structure in the skin in the right flank. Psychiatric: Mood and affect are normal. Speech and behavior are normal.  ____________________________________________  ED COURSE:  Pertinent labs & imaging results that were available during my care of the patient were reviewed by me and considered in my medical decision making (see chart for details). Clinical Course   Patient presents to ER in no distress, we will assess  with labs and ultrasound. Pain seems to be focused in the right upper quadrant.  Procedures ____________________________________________   LABS (pertinent positives/negatives)  Labs Reviewed  COMPREHENSIVE METABOLIC PANEL - Abnormal; Notable for the following:       Result Value   Potassium 3.4 (*)    Glucose, Bld 165 (*)    BUN <5 (*)    All other components within normal limits  CBC - Abnormal; Notable for the  following:    MCV 77.3 (*)    MCH 24.7 (*)    MCHC 31.9 (*)    RDW 17.2 (*)    All other components within normal limits  URINALYSIS COMPLETEWITH MICROSCOPIC ([facility] ONLY) - Abnormal; Notable for the following:    Color, Urine YELLOW (*)    APPearance CLEAR (*)    Hgb urine dipstick 1+ (*)    Squamous Epithelial / LPF 6-30 (*)    All other components within normal limits  LIPASE, BLOOD  PREGNANCY, URINE    RADIOLOGY  Abdominal ultrasound, chest x-ray Are unremarkable ____________________________________________  FINAL ASSESSMENT AND PLAN  Abdominal pain, vomiting and diarrhea, cough  Plan: Patient with labs and imaging as dictated above. Patient's no acute distress, no clear etiology for her symptoms. She'll be given cough medication and antiemetics and encouraged to have close follow-up with her doctor for recheck. Right flank pain is likely muscle strain secondary to cough.   Emily FilbertWilliams, Jonathan E, MD   Note: This dictation was prepared with Dragon dictation. Any transcriptional errors that result from this process are unintentional    Emily FilbertJonathan E Williams, MD 05/12/16 [phone] [id number]

## 2016-05-12 NOTE — ED Triage Notes (Signed)
Pt with abd pain and vomiting/diarrhea.

## 2016-07-12 ENCOUNTER — Other Ambulatory Visit: Payer: Self-pay | Admitting: Neurology

## 2016-07-12 DIAGNOSIS — M545 Low back pain: Secondary | ICD-10-CM

## 2016-07-21 ENCOUNTER — Ambulatory Visit
Admission: RE | Admit: 2016-07-21 | Discharge: 2016-07-21 | Disposition: A | Discharge status: Home / Self Care | Payer: Medicare Other | Source: Ambulatory Visit | Attending: Neurology | Admitting: Neurology

## 2016-07-21 DIAGNOSIS — M545 Low back pain: Secondary | ICD-10-CM | POA: Diagnosis present

## 2016-07-21 DIAGNOSIS — M5136 Other intervertebral disc degeneration, lumbar region: Secondary | ICD-10-CM | POA: Diagnosis not present

## 2016-07-28 DIAGNOSIS — M545 Low back pain: Secondary | ICD-10-CM

## 2016-07-28 DIAGNOSIS — G8929 Other chronic pain: Secondary | ICD-10-CM | POA: Insufficient documentation

## 2016-07-29 DIAGNOSIS — G5601 Carpal tunnel syndrome, right upper limb: Secondary | ICD-10-CM | POA: Insufficient documentation

## 2018-02-09 DIAGNOSIS — R202 Paresthesia of skin: Secondary | ICD-10-CM

## 2018-02-09 DIAGNOSIS — M79642 Pain in left hand: Secondary | ICD-10-CM

## 2018-02-09 DIAGNOSIS — R200 Anesthesia of skin: Secondary | ICD-10-CM | POA: Insufficient documentation

## 2018-02-09 DIAGNOSIS — M79641 Pain in right hand: Secondary | ICD-10-CM | POA: Insufficient documentation

## 2018-07-10 ENCOUNTER — Other Ambulatory Visit: Payer: Self-pay

## 2018-07-10 ENCOUNTER — Encounter: Payer: Self-pay | Admitting: Nurse Practitioner

## 2018-07-10 ENCOUNTER — Ambulatory Visit: Payer: Medicare Other | Attending: Nurse Practitioner | Admitting: Nurse Practitioner

## 2018-07-10 VITALS — BP 135/85 | HR 84 | Temp 98.0°F | Resp 18 | Ht 62.0 in | Wt 186.0 lb

## 2018-07-10 DIAGNOSIS — M79604 Pain in right leg: Secondary | ICD-10-CM | POA: Diagnosis present

## 2018-07-10 DIAGNOSIS — G8929 Other chronic pain: Secondary | ICD-10-CM | POA: Diagnosis present

## 2018-07-10 DIAGNOSIS — M5441 Lumbago with sciatica, right side: Secondary | ICD-10-CM | POA: Insufficient documentation

## 2018-07-10 DIAGNOSIS — M899 Disorder of bone, unspecified: Secondary | ICD-10-CM | POA: Diagnosis present

## 2018-07-10 DIAGNOSIS — M5442 Lumbago with sciatica, left side: Secondary | ICD-10-CM

## 2018-07-10 DIAGNOSIS — Z79899 Other long term (current) drug therapy: Secondary | ICD-10-CM | POA: Diagnosis not present

## 2018-07-10 DIAGNOSIS — M79605 Pain in left leg: Secondary | ICD-10-CM | POA: Insufficient documentation

## 2018-07-10 DIAGNOSIS — Z789 Other specified health status: Secondary | ICD-10-CM | POA: Insufficient documentation

## 2018-07-10 DIAGNOSIS — G894 Chronic pain syndrome: Secondary | ICD-10-CM

## 2018-07-10 NOTE — Progress Notes (Signed)
Safety precautions to be maintained throughout the outpatient stay will include: orient to surroundings, keep bed in low position, maintain call bell within reach at all times, provide assistance with transfer out of bed and ambulation.  

## 2018-07-10 NOTE — Patient Instructions (Signed)

## 2018-07-10 NOTE — Progress Notes (Signed)
Patient's Name: Dawn Frank  MRN: [id number]  Referring Provider: Anabel Bene, MD  DOB: [birth year]  PCP: [facility] [place]  DOS: 07/10/2018  Note by: Dionisio David NP  Service setting: Ambulatory outpatient  Specialty: Interventional Pain Management  Location: [facility] (AMB) Pain Management Facility    Patient type: New Patient    Primary Reason(s) for Visit: Initial Patient Evaluation CC: Leg Pain (bilateral); Foot Pain (bilateral); and Back Pain (lower)  HPI  [name] is a 33 y.o. year old, female patient, who comes today for an initial evaluation. 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 Her primarily concern today is the Leg Pain (bilateral); Foot Pain (bilateral); and Back Pain (lower)  Pain Assessment: Location: Left, Right Back Radiating: radiates down fron of legs to feet, bottom and side of feet, sometimes will effect toes ( starts with the great toes and goes down all toes bilateral Onset: More than a month ago(4-5 months) Duration: Chronic pain Quality: Aching, Constant(numbness in feet sometimes) Severity: 10-Worst pain ever/10 (subjective, self-reported pain score)  Note: Reported level is compatible with observation.                         When using our objective Pain Scale, levels between 6 and 10/10 are said to belong in an emergency room, as it progressively worsens from a 6/10, described as severely limiting, requiring emergency care not usually available at an outpatient pain management facility.  At a 6/10 level, communication becomes difficult and requires great effort. Assistance to reach the emergency department may be required. Facial flushing and profuse sweating along with potentially dangerous increases in heart rate and blood pressure will be evident. Effect on ADL: standing in one spot, walking long distances Timing: Constant Modifying factors: rest, heat BP: 135/85  HR: 84  Onset and Duration: Gradual Cause of pain: cerebral palsy Severity: NAS-11 at its worse: 10/10, NAS-11 at its best: 7/10, NAS-11 now: 8/10 and NAS-11 on the average: 9/10 Timing: Night and After activity or exercise Aggravating Factors: Climbing, Kneeling, Prolonged sitting and Prolonged standing Alleviating Factors: Lying down, Resting and Warm showers or baths Associated Problems: Numbness and Tingling Quality of Pain: Aching, Pressure-like, Shooting, Uncomfortable and Work related Previous Examinations or Tests: X-rays and Chiropractic evaluation Previous Treatments: Relaxation therapy  The patient comes into the clinics today for the first time for a chronic pain management evaluation. According to the patient her primary area of pain is in her lower back.  She feels like the pain is just getting worse secondary to her cerebral palsy.  She denies any previous treatments of surgery or interventional therapy.  She had did have some physical therapy in 2018 which did help a little.  She denies any recent images.  Second area of pain is in her legs.  She admits that it rotates around to the front of her legs down into the bottom of her feet.  She has numbness and tingling.  She feels like there is some weakness.  She denies a previous nerve conduction study.  Today I took the time  to provide the patient with information regarding this pain practice. The patient was informed that the practice is divided into two sections: an interventional pain management section, as well as a completely separate and  distinct medication management section. I explained that there are procedure days for interventional therapies, and evaluation days for follow-ups and medication management. Because of the amount of documentation required during both, they are kept separated. This means that there is the possibility that she may be scheduled for a procedure on one day, and medication management the next. I have also informed her that because of staffing and facility limitations, this practice will no longer take patients for medication management only. To illustrate the reasons for this, I gave the patient the example of surgeons, and how inappropriate it would be to refer a patient to his/her care, just to write for the post-surgical antibiotics on a surgery done by a different surgeon.   Because interventional pain management is part of the board-certified specialty for the doctors, the patient was informed that joining this practice means that they are open to any and all interventional therapies. I made it clear that this does not mean that they will be forced to have any procedures done. What this means is that I believe interventional therapies to be essential part of the diagnosis and proper management of chronic pain conditions. Therefore, patients not interested in these interventional alternatives will be better served under the care of a different practitioner.  The patient was also made aware of my Comprehensive Pain Management Safety Guidelines where by joining this practice, they limit all of their nerve blocks and joint injections to those done by our practice, for as long as we are retained to manage their care. Historic Controlled Substance Pharmacotherapy Review  PMP and historical list of controlled substances: Pregabalin 50 mg, hydrocodone/Chlorphen ER suspension, Lyrica 25 mg, hydrocodone/acetaminophen 5/325 mg, codeine/guaifenesin 10/100 mg per 5 mL's, Cheratussin AC syrup, acetaminophen with codeine No.  3, oxycodone/acetaminophen 5/325 mg, diazepam 5 mg, Highest opioid analgesic regimen found: Hydrocodone/acetaminophen 5/325 mg 1 tablet 5 times daily (fill date 08/15/2015) hydrocodone 25 mg/day Most recent opioid analgesic: None Current opioid analgesics: None Highest recorded MME/day: 20 mg/day MME/day: 0 mg/day Medications: The patient did not bring the medication(s) to the appointment, as requested in our "New Patient Package" Pharmacodynamics: Desired effects: Analgesia: The patient reports >50% benefit. Reported improvement in function: The patient reports medication allows her to accomplish basic ADLs. Clinically meaningful improvement in function (CMIF): Sustained CMIF goals met Perceived effectiveness: Described as relatively effective, allowing for increase in activities of daily living (ADL) Undesirable effects: Side-effects or Adverse reactions: None reported Historical Monitoring: The patient  reports no history of drug use. List of all UDS Test(s): No results found for: MDMA, COCAINSCRNUR, PCPSCRNUR, PCPQUANT, CANNABQUANT, THCU, [place] List of all Serum Drug Screening Test(s):  No results found for: AMPHSCRSER, BARBSCRSER, BENZOSCRSER, COCAINSCRSER, PCPSCRSER, PCPQUANT, THCSCRSER, CANNABQUANT, OPIATESCRSER, OXYSCRSER, PROPOXSCRSER Historical Background Evaluation: [place] PDMP: Six (6) year initial data search conducted.             [place] Department of public safety, offender search: Editor, commissioning Information) Non-contributory Risk Assessment Profile: Aberrant behavior: None observed or detected today Risk factors for fatal opioid overdose: None identified today Fatal overdose hazard ratio (HR): Calculation deferred Non-fatal overdose hazard ratio (HR): Calculation deferred Risk of opioid abuse or dependence: 0.7-3.0% with doses ? 36 MME/day and 6.1-26% with doses ? 120 MME/day. Substance use disorder (SUD) risk level: Pending results of  Medical Psychology Evaluation for SUD Opioid risk  tool (ORT) (Total Score): 0  ORT Scoring interpretation table:  Score <3 = Low Risk for SUD  Score between 4-7 = Moderate Risk for SUD  Score >8 = High Risk for Opioid Abuse   PHQ-2 Depression Scale:  Total score:    PHQ-2 Scoring interpretation table: (Score and probability of major depressive disorder)  Score 0 = No depression  Score 1 = 15.4% Probability  Score 2 = 21.1% Probability  Score 3 = 38.4% Probability  Score 4 = 45.5% Probability  Score 5 = 56.4% Probability  Score 6 = 78.6% Probability   PHQ-9 Depression Scale:  Total score:    PHQ-9 Scoring interpretation table:  Score 0-4 = No depression  Score 5-9 = Mild depression  Score 10-14 = Moderate depression  Score 15-19 = Moderately severe depression  Score 20-27 = Severe depression (2.4 times higher risk of SUD and 2.89 times higher risk of overuse)   Pharmacologic Plan: Pending ordered tests and/or consults  Meds  The patient has a current medication list which includes the following prescription(s): cyclobenzaprine, hydrochlorothiazide, lisinopril, naproxen, nortriptyline, sertraline, vitamin c, azithromycin, chlorpheniramine-hydrocodone, guaifenesin-codeine, hydrocodone-acetaminophen, ondansetron, chlorpheniramine, and gabapentin.  Current Outpatient Medications on File Prior to Visit  Medication Sig  . cyclobenzaprine (FLEXERIL) 10 MG tablet Take 10 mg by mouth at bedtime.  . hydrochlorothiazide (HYDRODIURIL) 12.5 MG tablet Take 12.5 mg by mouth daily.  Marland Kitchen lisinopril (PRINIVIL,ZESTRIL) 10 MG tablet Take 10 mg by mouth daily.  . naproxen (NAPROSYN) 500 MG tablet Take 1 tablet (500 mg total) by mouth 2 (two) times daily with a meal.  . nortriptyline (PAMELOR) 10 MG capsule Take 10 mg by mouth at bedtime.  . sertraline (ZOLOFT) 50 MG tablet Take 50 mg by mouth daily.  . Ascorbic Acid (VITAMIN C) 1000 MG tablet Take 1,000 mg by mouth daily.  Marland Kitchen azithromycin (ZITHROMAX Z-PAK) 250 MG tablet Take 2 tablets (500 mg) on   Day 1,  followed by 1 tablet (250 mg) once daily on Days 2 through 5. (Patient not taking: Reported on 07/10/2018)  . chlorpheniramine-HYDROcodone (TUSSIONEX PENNKINETIC ER) 10-8 MG/5ML SUER Take 5 mLs by mouth 2 (two) times daily. (Patient not taking: Reported on 07/10/2018)  . guaiFENesin-codeine 100-10 MG/5ML syrup Take 10 mLs by mouth every 4 (four) hours as needed for cough. (Patient not taking: Reported on 07/10/2018)  . HYDROcodone-acetaminophen (NORCO) 5-325 MG tablet Take 1-2 tablets by mouth every 4 (four) hours as needed for moderate pain. (Patient not taking: Reported on 07/10/2018)  . ondansetron (ZOFRAN) 4 MG tablet Take 1 tablet (4 mg total) by mouth daily as needed for nausea or vomiting. (Patient not taking: Reported on 07/10/2018)  . [DISCONTINUED] chlorpheniramine (CHLOR-TRIMETON) 4 MG tablet Take 1 tablet (4 mg total) by mouth 2 (two) times daily as needed for allergies or rhinitis.  . [DISCONTINUED] gabapentin (NEURONTIN) 300 MG capsule Take 900 mg by mouth 3 (three) times daily.   No current facility-administered medications on file prior to visit.    Imaging Review  Lumbosacral Imaging: Lumbar MR wo contrast:  Results for orders placed during the hospital encounter of 07/21/16  MR LUMBAR SPINE WO CONTRAST   Narrative CLINICAL DATA:  33 year old female with chronic lumbar back pain, increased recently following bronchitis with coughing. Initial encounter.  EXAM: MRI LUMBAR SPINE WITHOUT CONTRAST  TECHNIQUE: Multiplanar, multisequence MR imaging of the lumbar spine was performed. No intravenous contrast was administered.  COMPARISON:  Lumbar radiographs 08/15/2015.  FINDINGS: Segmentation: Normal as seen on the comparison radiographs, although there is a vestigial S1-S2 disc space.  Alignment: Stable preserved lumbar lordosis. Mildly exaggerated lordosis with trace retrolisthesis at L5-S1.  Vertebrae: No marrow edema or evidence of acute osseous  abnormality. Visualized bone marrow signal is within normal limits. Negative visualized sacrum and SI joints.  Conus medullaris: Extends to the L1-L2 level and appears normal.  Paraspinal and other soft tissues: Negative.  Disc levels:  T11-T12:  Negative.  T12-L1:  Mild facet hypertrophy, otherwise negative.  L1-L2:  Negative.  L2-L3:  Negative.  L3-L4:  Mild right facet hypertrophy, otherwise negative.  L4-L5:  Mild right facet hypertrophy, otherwise negative.  L5-S1:  Mild bilateral facet hypertrophy, otherwise negative.  IMPRESSION: Mild lumbar facet degeneration, but otherwise normal MRI appearance of the lumbar spine.  No acute osseous abnormality and no disc herniation or neural impingement.   Electronically Signed   By: Genevie Ann M.D.   On: 07/21/2016 10:28    Lumbar DG 2-3 views:  Results for orders placed during the hospital encounter of 08/15/15  DG Lumbar Spine 2-3 Views   Narrative CLINICAL DATA:  Persistent low back pain radiating to the bilateral thighs. History of cerebral palsy.  EXAM: LUMBAR SPINE - 2-3 VIEW  COMPARISON:  01/28/2013  FINDINGS: There are 5 non rib-bearing lumbar type vertebral bodies.  Normal alignment of the lumbar spine. No anterolisthesis or retrolisthesis.  Lumbar vertebral body heights are preserved.  Intervertebral disc space heights are preserved.  Limited visualization of the bilateral SI joints is normal.  Regional bowel gas pattern is normal.  IMPRESSION: No explanation for patient's chronic back pain.   Electronically Signed   By: Sandi Mariscal M.D.   On: 08/15/2015 14:11    Note: Available results from prior imaging studies were reviewed.        ROS  Cardiovascular History: No reported cardiovascular signs or symptoms such as High blood pressure, coronary artery disease, abnormal heart rate or rhythm, heart attack, blood thinner therapy or heart weakness and/or failure Pulmonary or Respiratory  History: Smoking Neurological History: No reported neurological signs or symptoms such as seizures, abnormal skin sensations, urinary and/or fecal incontinence, being born with an abnormal open spine and/or a tethered spinal cord Review of Past Neurological Studies: No results found for this or any previous visit. Psychological-Psychiatric History: Anxiousness, Depressed and Prone to panicking Gastrointestinal History: No reported gastrointestinal signs or symptoms such as vomiting or evacuating blood, reflux, heartburn, alternating episodes of diarrhea and constipation, inflamed or scarred liver, or pancreas or irrregular and/or infrequent bowel movements Genitourinary History: No reported renal or genitourinary signs or symptoms such as difficulty voiding or producing urine, peeing blood, non-functioning kidney, kidney stones, difficulty emptying the bladder, difficulty controlling the flow of urine, or chronic kidney disease Hematological History: No reported hematological signs or symptoms such as prolonged bleeding, low or poor functioning platelets, bruising or bleeding easily, hereditary bleeding problems, low energy levels due to low hemoglobin or being anemic Endocrine History: No reported endocrine signs or symptoms such as high or low blood sugar, rapid heart rate due to high thyroid levels, obesity or weight gain due to slow thyroid or thyroid disease Rheumatologic History: No reported rheumatological signs and symptoms such as fatigue, joint pain, tenderness, swelling, redness, heat, stiffness, decreased range of motion, with or without associated rash Musculoskeletal History: Negative for myasthenia gravis, muscular dystrophy, multiple sclerosis or malignant hyperthermia Work History: Working part time  Allergies  Ms. Eppinger has  No Known Allergies.  Laboratory Chemistry  Inflammation Markers No results found for: CRP, ESRSEDRATE (CRP: Acute Phase) (ESR: Chronic Phase) Renal Function  Markers Lab Results  Component Value Date   BUN <5 (L) 05/12/2016   CREATININE 0.77 05/12/2016   GFRAA >60 05/12/2016   GFRNONAA >60 05/12/2016   Hepatic Function Markers Lab Results  Component Value Date   AST 34 05/12/2016   ALT 32 05/12/2016   ALBUMIN 3.6 05/12/2016   ALKPHOS 77 05/12/2016   Electrolytes Lab Results  Component Value Date   NA 137 05/12/2016   K 3.4 (L) 05/12/2016   CL 107 05/12/2016   CALCIUM 9.1 05/12/2016   Neuropathy Markers No results found for: [id number] Bone Pathology Markers Lab Results  Component Value Date   ALKPHOS 77 05/12/2016   CALCIUM 9.1 05/12/2016   Coagulation Parameters Lab Results  Component Value Date   PLT 231 05/12/2016   Cardiovascular Markers Lab Results  Component Value Date   HGB 12.0 05/12/2016   HCT 37.6 05/12/2016   Note: Lab results reviewed.  [place]  Drug: Ms. Czerwinski  reports no history of drug use. Alcohol:  reports no history of alcohol use. Tobacco:  reports that she has been smoking cigarettes. She has a 3.00 pack-year smoking history. She has never used smokeless tobacco. Medical:  has a past medical history of Cerebral palsy ([place]), Chronic back pain, and Hypertension. Family: family history is not on file.  No past surgical history on file. Active Ambulatory Problems    Diagnosis Date Noted  . Cerebral palsy ([place])   . Chronic bilateral low back pain without sciatica 07/28/2016  . Bilateral hand pain 02/09/2018  . 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 Leg pain, bilateral 02/09/2018  . Numbness and tingling 02/09/2018  . Right carpal tunnel syndrome 07/29/2016  . Chronic pain syndrome 07/10/2018  . Pharmacologic therapy 07/10/2018  . Disorder of skeletal system 07/10/2018  . Problems influencing health status 07/10/2018  . Chronic bilateral low back pain with bilateral sciatica (Primary Area of Pain) (R>L) 07/10/2018  . Chronic pain of both lower extremities (Secondary Area of Pain) (R>L)  07/10/2018   Resolved Ambulatory Problems    Diagnosis Date Noted  . No Resolved Ambulatory Problems   Past Medical History:  Diagnosis Date  . Chronic back pain   . Hypertension    Constitutional Exam  General appearance: Well nourished, well developed, and well hydrated. In no apparent acute distress Vitals:   07/10/18 1318  BP: 135/85  Pulse: 84  Resp: 18  Temp: 98 F (36.7 C)  SpO2: 100%  Weight: 186 lb (84.4 kg)  Height: _0  (1.575 m)   BMI Assessment: Estimated body mass index is 34.02 kg/m as calculated from the following:   Height as of this encounter: _1  (1.575 m).   Weight as of this encounter: 186 lb (84.4 kg).  BMI interpretation table: BMI level Category Range association with higher incidence of chronic pain  <18 kg/m2 Underweight   18.5-24.9 kg/m2 Ideal body weight   25-29.9 kg/m2 Overweight Increased incidence by 20%  30-34.9 kg/m2 Obese (Class I) Increased incidence by 68%  35-39.9 kg/m2 Severe obesity (Class II) Increased incidence by 136%  >40 kg/m2 Extreme obesity (Class III) Increased incidence by 254%   BMI Readings from Last 4 Encounters:  07/10/18 34.02 kg/m  05/12/16 37.49 kg/m  08/15/15 38.59 kg/m  04/20/15 39.87 kg/m   Wt Readings from Last 4 Encounters:  07/10/18 186 lb (84.4 kg)  05/12/16 205 lb (93 kg)  08/15/15 211 lb (95.7 kg)  04/20/15 218 lb (98.9 kg)  Psych/Mental status: Alert, oriented x 3 (person, place, & time)       Eyes: PERLA Respiratory: No evidence of acute respiratory distress  Cervical Spine Exam  Inspection: No masses, redness, or swelling Alignment: Symmetrical Functional ROM: Unrestricted ROM      Stability: No instability detected Muscle strength & Tone: Functionally intact Sensory: Unimpaired Palpation: No palpable anomalies              Upper Extremity (UE) Exam    Side: Right upper extremity  Side: Left upper extremity  Inspection: No masses, redness, swelling, or asymmetry. No contractures   Inspection: No masses, redness, swelling, or asymmetry. No contractures  Functional ROM: Unrestricted ROM          Functional ROM: Unrestricted ROM          Muscle strength & Tone: Functionally intact  Muscle strength & Tone: Functionally intact  Sensory: Unimpaired  Sensory: Unimpaired  Palpation: No palpable anomalies              Palpation: No palpable anomalies              Specialized Test(s): Deferred         Specialized Test(s): Deferred          Thoracic Spine Exam  Inspection: No masses, redness, or swelling Alignment: Symmetrical Functional ROM: Unrestricted ROM Stability: No instability detected Sensory: Unimpaired Muscle strength & Tone: No palpable anomalies  Lumbar Spine Exam  Inspection: No masses, redness, or swelling Alignment: Symmetrical Functional ROM: Adequate ROM      Stability: No instability detected Muscle strength & Tone: Functionally intact Sensory: Unimpaired Palpation: Complains of area being tender to palpation       Provocative Tests: Lumbar Hyperextension and rotation test: Non-contributory       Patrick's Maneuver: Negative                    Gait & Posture Assessment  Ambulation: Unassisted Gait: Uneven Posture: WNL   Lower Extremity Exam    Side: Right lower extremity  Side: Left lower extremity  Inspection: No masses, redness, swelling, or asymmetry. No contractures  Inspection: No masses, redness, swelling, or asymmetry. No contractures  Functional ROM: Unrestricted ROM          Functional ROM: Unrestricted ROM          Muscle strength & Tone: Mild-to-moderate deconditioning  Muscle strength & Tone: Mild-to-moderate deconditioning  Sensory: Unimpaired  Sensory: Unimpaired  Palpation: No palpable anomalies 2+ pulses  Palpation: No palpable anomalies 2+ pulses tenderness of great toe   Assessment  Primary Diagnosis & Pertinent Problem List: The primary encounter diagnosis was Chronic bilateral low back pain with bilateral sciatica (Primary  Area of Pain) (R>L). Diagnoses of Chronic pain of both lower extremities, Chronic pain syndrome, Pharmacologic therapy, Disorder of skeletal system, and Problems influencing health status were also pertinent to this visit.  Visit Diagnosis: 1. Chronic bilateral low back pain with bilateral sciatica (Primary Area of Pain) (R>L)   2. Chronic pain of both lower extremities   3. Chronic pain syndrome   4. Pharmacologic therapy   5. Disorder of skeletal system   6. Problems influencing health status    Plan of Care  Initial treatment plan:  Please be advised that as per protocol, today's visit has been an evaluation only. We have not taken over the patient's  controlled substance management.  Problem-specific plan: No problem-specific Assessment & Plan notes found for this encounter.  Ordered Lab-work, Procedure(s), Referral(s), & Consult(s): Orders Placed This Encounter  Procedures  . DG Lumbar Spine Complete W/Bend  . Compliance Drug Analysis, Ur  . Comp. Metabolic Panel (12)  . Magnesium  . Vitamin B12  . Sedimentation rate  . 25-Hydroxyvitamin D Lcms D2+D3  . C-reactive protein  . Uric acid  . Ambulatory referral to Physical Therapy   Pharmacotherapy: Medications ordered:  No orders of the defined types were placed in this encounter.  Medications administered during this visit: Timia D. Murdoch had no medications administered during this visit.   Pharmacotherapy under consideration:  Opioid Analgesics: The patient was informed that there is no guarantee that she would be a candidate for opioid analgesics. The decision will be made following CDC guidelines. This decision will be based on the results of diagnostic studies, as well as Ms. Gammell's risk profile.  Membrane stabilizer: To be determined at a later time Muscle relaxant: To be determined at a later time NSAID: To be determined at a later time Other analgesic(s): To be determined at a later time   Interventional  therapies under consideration: Ms. Geraghty was informed that there is no guarantee that she would be a candidate for interventional therapies. The decision will be based on the results of diagnostic studies, as well as Ms. Colin's risk profile.  Possible procedure(s): Diagnostic midline lumbar epidural steroid injection Diagnostic bilateral lumbar facet nerve block Possible bilateral lumbar facet radiofrequency ablation   Provider-requested follow-up: Return for 2nd Visit, w/ Dr. Dossie Arbour.  Future Appointments  Date Time Provider [place]  07/30/2018  9:30 AM Milinda Pointer, MD [facility] None    Primary Care Physician: [facility] [place] Location: [facility] Facility Note by:  Date: 07/10/2018; Time: 3:29 PM  Pain Score Disclaimer: We use the NRS-11 scale. This is a self-reported, subjective measurement of pain severity with only modest accuracy. It is used primarily to identify changes within a particular patient. It must be understood that outpatient pain scales are significantly less accurate that those used for research, where they can be applied under ideal controlled circumstances with minimal exposure to variables. In reality, the score is likely to be a combination of pain intensity and pain affect, where pain affect describes the degree of emotional arousal or changes in action readiness caused by the sensory experience of pain. Factors such as social and work situation, setting, emotional state, anxiety levels, expectation, and prior pain experience may influence pain perception and show large inter-individual differences that may also be affected by time variables.  Patient instructions provided during this appointment: Patient Instructions   ____________________________________________________________________________________________  Appointment Policy Summary  It is our goal and responsibility to provide the medical  community with assistance in the evaluation and management of patients with chronic pain. Unfortunately our resources are limited. Because we do not have an unlimited amount of time, or available appointments, we are required to closely monitor and manage their use. The following rules exist to maximize their use:  Patient's responsibilities: 1. Punctuality:  At what time should I arrive? You should be physically present in our office 30 minutes before your scheduled appointment. Your scheduled appointment is with your assigned healthcare provider. However, it takes 5-10 minutes to be "checked-in", and another 15 minutes for the nurses to do the admission. If you arrive to our office at the time you were given for your  appointment, you will end up being at least 20-25 minutes late to your appointment with the provider. 2. Tardiness:  What happens if I arrive only a few minutes after my scheduled appointment time? You will need to reschedule your appointment. The cutoff is your appointment time. This is why it is so important that you arrive at least 30 minutes before that appointment. If you have an appointment scheduled for 10:00 AM and you arrive at 10:01, you will be required to reschedule your appointment.  3. Plan ahead:  Always assume that you will encounter traffic on your way in. Plan for it. If you are dependent on a driver, make sure they understand these rules and the need to arrive early. 4. Other appointments and responsibilities:  Avoid scheduling any other appointments before or after your pain clinic appointments.  5. Be prepared:  Write down everything that you need to discuss with your healthcare provider and give this information to the admitting nurse. Write down the medications that you will need refilled. Bring your pills and bottles (even the empty ones), to all of your appointments, except for those where a procedure is scheduled. 6. No children or pets:  Find someone to take  care of them. It is not appropriate to bring them in. 7. Scheduling changes:  We request "advanced notification" of any changes or cancellations. 8. Advanced notification:  Defined as a time period of more than 24 hours prior to the originally scheduled appointment. This allows for the appointment to be offered to other patients. 9. Rescheduling:  When a visit is rescheduled, it will require the cancellation of the original appointment. For this reason they both fall within the category of "Cancellations".  10. Cancellations:  They require advanced notification. Any cancellation less than 24 hours before the  appointment will be recorded as a "No Show". 11. No Show:  Defined as an unkept appointment where the patient failed to notify or declare to the practice their intention or inability to keep the appointment.  Corrective process for repeat offenders:  1. Tardiness: Three (3) episodes of rescheduling due to late arrivals will be recorded as one (1) "No Show". 2. Cancellation or reschedule: Three (3) cancellations or rescheduling will be recorded as one (1) "No Show". 3. "No Shows": Three (3) "No Shows" within a 12 month period will result in discharge from the practice. ____________________________________________________________________________________________   ______________________________________________________________________________________________  Specialty Pain Scale  Introduction:  There are significant differences in how pain is reported. The word pain usually refers to physical pain, but it is also a common synonym of suffering. The medical community uses a scale from 0 (zero) to 10 (ten) to report pain level. Zero (0) is described as "no pain", while ten (10) is described as "the worse pain you can imagine". The problem with this scale is that physical pain is reported along with suffering. Suffering refers to mental pain, or more often yet it refers to any unpleasant  feeling, emotion or aversion associated with the perception of harm or threat of harm. It is the psychological component of pain.  Pain Specialists prefer to separate the two components. The pain scale used by this practice is the Verbal Numerical Rating Scale (VNRS-11). This scale is for the physical pain only. DO NOT INCLUDE how your pain psychologically affects you. This scale is for adults 34 years of age and older. It has 11 (eleven) levels. The 1st level is 0/10. This means: "right now, I have no pain". In the  context of pain management, it also means: "right now, my physical pain is under control with the current therapy".  General Information:  The scale should reflect your current level of pain. Unless you are specifically asked for the level of your worst pain, or your average pain. If you are asked for one of these two, then it should be understood that it is over the past 24 hours.  Levels 1 (one) through 5 (five) are described below, and can be treated as an outpatient. Ambulatory pain management facilities such as ours are more than adequate to treat these levels. Levels 6 (six) through 10 (ten) are also described below, however, these must be treated as a hospitalized patient. While levels 6 (six) and 7 (seven) may be evaluated at an urgent care facility, levels 8 (eight) through 10 (ten) constitute medical emergencies and as such, they belong in a hospital's emergency department. When having these levels (as described below), do not come to our office. Our facility is not equipped to manage these levels. Go directly to an urgent care facility or an emergency department to be evaluated.  Definitions:  Activities of Daily Living (ADL): Activities of daily living (ADL or ADLs) is a term used in healthcare to refer to people's daily self-care activities. Health professionals often use a person's ability or inability to perform ADLs as a measurement of their functional status, particularly in  regard to people post injury, with disabilities and the elderly. There are two ADL levels: Basic and Instrumental. Basic Activities of Daily Living (BADL  or BADLs) consist of self-care tasks that include: Bathing and showering; personal hygiene and grooming (including brushing/combing/styling hair); dressing; Toilet hygiene (getting to the toilet, cleaning oneself, and getting back up); eating and self-feeding (not including cooking or chewing and swallowing); functional mobility, often referred to as "transferring", as measured by the ability to walk, get in and out of bed, and get into and out of a chair; the broader definition (moving from one place to another while performing activities) is useful for people with different physical abilities who are still able to get around independently. Basic ADLs include the things many people do when they get up in the morning and get ready to go out of the house: get out of bed, go to the toilet, bathe, dress, groom, and eat. On the average, loss of function typically follows a particular order. Hygiene is the first to go, followed by loss of toilet use and locomotion. The last to go is the ability to eat. When there is only one remaining area in which the person is independent, there is a 62.9% chance that it is eating and only a 3.5% chance that it is hygiene. Instrumental Activities of Daily Living (IADL or IADLs) are not necessary for fundamental functioning, but they let an individual live independently in a community. IADL consist of tasks that include: cleaning and maintaining the house; home establishment and maintenance; care of others (including selecting and supervising caregivers); care of pets; child rearing; managing money; managing financials (investments, etc.); meal preparation and cleanup; shopping for groceries and necessities; moving within the community; safety procedures and emergency responses; health management and maintenance (taking prescribed  medications); and using the telephone or other form of communication.  Instructions:  Most patients tend to report their pain as a combination of two factors, their physical pain and their psychosocial pain. This last one is also known as "suffering" and it is reflection of how physical pain affects you socially  and psychologically. From now on, report them separately.  From this point on, when asked to report your pain level, report only your physical pain. Use the following table for reference.  Pain Clinic Pain Levels (0-5/10)  Pain Level Score  Description  No Pain 0   Mild pain 1 Nagging, annoying, but does not interfere with basic activities of daily living (ADL). Patients are able to eat, bathe, get dressed, toileting (being able to get on and off the toilet and perform personal hygiene functions), transfer (move in and out of bed or a chair without assistance), and maintain continence (able to control bladder and bowel functions). Blood pressure and heart rate are unaffected. A normal heart rate for a healthy adult ranges from 60 to 100 bpm (beats per minute).   Mild to moderate pain 2 Noticeable and distracting. Impossible to hide from other people. More frequent flare-ups. Still possible to adapt and function close to normal. It can be very annoying and may have occasional stronger flare-ups. With discipline, patients may get used to it and adapt.   Moderate pain 3 Interferes significantly with activities of daily living (ADL). It becomes difficult to feed, bathe, get dressed, get on and off the toilet or to perform personal hygiene functions. Difficult to get in and out of bed or a chair without assistance. Very distracting. With effort, it can be ignored when deeply involved in activities.   Moderately severe pain 4 Impossible to ignore for more than a few minutes. With effort, patients may still be able to manage work or participate in some social activities. Very difficult to  concentrate. Signs of autonomic nervous system discharge are evident: dilated pupils (mydriasis); mild sweating (diaphoresis); sleep interference. Heart rate becomes elevated (>115 bpm). Diastolic blood pressure (lower number) rises above 100 mmHg. Patients find relief in laying down and not moving.   Severe pain 5 Intense and extremely unpleasant. Associated with frowning face and frequent crying. Pain overwhelms the senses.  Ability to do any activity or maintain social relationships becomes significantly limited. Conversation becomes difficult. Pacing back and forth is common, as getting into a comfortable position is nearly impossible. Pain wakes you up from deep sleep. Physical signs will be obvious: pupillary dilation; increased sweating; goosebumps; brisk reflexes; cold, clammy hands and feet; nausea, vomiting or dry heaves; loss of appetite; significant sleep disturbance with inability to fall asleep or to remain asleep. When persistent, significant weight loss is observed due to the complete loss of appetite and sleep deprivation.  Blood pressure and heart rate becomes significantly elevated. Caution: If elevated blood pressure triggers a pounding headache associated with blurred vision, then the patient should immediately seek attention at an urgent or emergency care unit, as these may be signs of an impending stroke.    Emergency Department Pain Levels (6-10/10)  Emergency Room Pain 6 Severely limiting. Requires emergency care and should not be seen or managed at an outpatient pain management facility. Communication becomes difficult and requires great effort. Assistance to reach the emergency department may be required. Facial flushing and profuse sweating along with potentially dangerous increases in heart rate and blood pressure will be evident.   Distressing pain 7 Self-care is very difficult. Assistance is required to transport, or use restroom. Assistance to reach the emergency department  will be required. Tasks requiring coordination, such as bathing and getting dressed become very difficult.   Disabling pain 8 Self-care is no longer possible. At this level, pain is disabling. The individual is unable  to do even the most "basic" activities such as walking, eating, bathing, dressing, transferring to a bed, or toileting. Fine motor skills are lost. It is difficult to think clearly.   Incapacitating pain 9 Pain becomes incapacitating. Thought processing is no longer possible. Difficult to remember your own name. Control of movement and coordination are lost.   The worst pain imaginable 10 At this level, most patients pass out from pain. When this level is reached, collapse of the autonomic nervous system occurs, leading to a sudden drop in blood pressure and heart rate. This in turn results in a temporary and dramatic drop in blood flow to the brain, leading to a loss of consciousness. Fainting is one of the body's self defense mechanisms. Passing out puts the brain in a calmed state and causes it to shut down for a while, in order to begin the healing process.    Summary: 1. Refer to this scale when providing [place] with your pain level. 2. Be accurate and careful when reporting your pain level. This will help with your care. 3. Over-reporting your pain level will lead to loss of credibility. 4. Even a level of 1/10 means that there is pain and will be treated at our facility. 5. High, inaccurate reporting will be documented as "Symptom Exaggeration", leading to loss of credibility and suspicions of possible secondary gains such as obtaining more narcotics, or wanting to appear disabled, for fraudulent reasons. 6. Only pain levels of 5 or below will be seen at our facility. 7. Pain levels of 6 and above will be sent to the Emergency Department and the appointment cancelled. ______________________________________________________________________________________________

## 2018-07-14 LAB — MAGNESIUM: MAGNESIUM: 1.9 mg/dL (ref 1.6–2.3)

## 2018-07-14 LAB — 25-HYDROXY VITAMIN D LCMS D2+D3
25-Hydroxy, Vitamin D-2: <1 ng/mL
25-Hydroxy, Vitamin D-3: 13 ng/mL

## 2018-07-14 LAB — COMP. METABOLIC PANEL (12)
A/G RATIO: 1.9 (ref 1.2–2.2)
AST: 22 IU/L (ref 0–40)
Albumin: 4.3 g/dL (ref 3.8–4.8)
Alkaline Phosphatase: 88 IU/L (ref 39–117)
BUN/Creatinine Ratio: 12 (ref 9–23)
BUN: 10 mg/dL (ref 6–20)
CALCIUM: 9.8 mg/dL (ref 8.7–10.2)
CHLORIDE: 100 mmol/L (ref 96–106)
CREATININE: 0.83 mg/dL (ref 0.57–1.00)
GFR calc Af Amer: 109 mL/min/{1.73_m2} (ref >59)
GFR calc non Af Amer: 94 mL/min/{1.73_m2} (ref >59)
Globulin, Total: 2.3 g/dL (ref 1.5–4.5)
Glucose: 82 mg/dL (ref 65–99)
Potassium: 3.8 mmol/L (ref 3.5–5.2)
Sodium: 139 mmol/L (ref 134–144)
Total Protein: 6.6 g/dL (ref 6.0–8.5)

## 2018-07-14 LAB — VITAMIN B12: Vitamin B-12: 571 pg/mL (ref 232–1245)

## 2018-07-14 LAB — SEDIMENTATION RATE: SED RATE: 14 mm/h (ref 0–32)

## 2018-07-14 LAB — 25-HYDROXYVITAMIN D LCMS D2+D3: 25-HYDROXY, VITAMIN D: 13 ng/mL — AB

## 2018-07-14 LAB — C-REACTIVE PROTEIN: CRP: <1 mg/L (ref 0–10)

## 2018-07-14 LAB — URIC ACID: Uric Acid: 3.7 mg/dL (ref 2.5–7.1)

## 2018-07-14 LAB — COMPLIANCE DRUG ANALYSIS, UR

## 2018-07-16 ENCOUNTER — Ambulatory Visit
Admission: RE | Admit: 2018-07-16 | Discharge: 2018-07-16 | Disposition: A | Discharge status: Home / Self Care | Payer: Medicare Other | Source: Ambulatory Visit | Attending: Nurse Practitioner | Admitting: Nurse Practitioner

## 2018-07-16 ENCOUNTER — Encounter: Payer: Self-pay | Admitting: Nurse Practitioner

## 2018-07-16 DIAGNOSIS — M5442 Lumbago with sciatica, left side: Secondary | ICD-10-CM | POA: Insufficient documentation

## 2018-07-16 DIAGNOSIS — G8929 Other chronic pain: Secondary | ICD-10-CM

## 2018-07-16 DIAGNOSIS — F1290 Cannabis use, unspecified, uncomplicated: Secondary | ICD-10-CM | POA: Insufficient documentation

## 2018-07-16 DIAGNOSIS — M5441 Lumbago with sciatica, right side: Secondary | ICD-10-CM | POA: Insufficient documentation

## 2018-07-16 NOTE — Progress Notes (Signed)
Results were reviewed and found to be: abnormal  No acute injury or pathology identified  Review would suggest interventional pain management techniques may be of benefit

## 2018-07-26 NOTE — Progress Notes (Signed)
Patient's Name: Dawn Frank  MRN: [id number]  Referring Provider: Center, [facility]  DOB: 09/23/1985  PCP: Center, [place]: 07/30/2018  Note by: Gaspar Cola, MD  Service setting: Ambulatory outpatient  Specialty: Interventional Pain Management  Location: [facility] (AMB) Pain Management Facility    Patient type: Established   Primary Reason(s) for Visit: Encounter for evaluation before starting new chronic pain management plan of care (Level of risk: moderate) CC: Follow-up; Back Pain; and Leg Pain  HPI  Dawn Frank is a 33 y.o. year old, female patient, who comes today for a follow-up evaluation to review the test results and decide on a treatment plan. 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[place]); Numbness and tingling; Carpal tunnel syndrome (CTS) (Right); Chronic pain syndrome; Pharmacologic therapy; Disorder of skeletal system; Problems influencing health status; Chronic low back pain (Primary area of Pain) (Bilateral) (R>L) w/ sciatica (Bilateral); Chronic lower extremity pain (Secondary Area of Pain) (Bilateral) (R>L); Marijuana use; Vitamin D deficiency; Lumbar facet hypertrophy (Multilevel); Levoscoliosis; Chronic hip pain (Bilateral) (L>R); Chronic sacroiliac joint pain (Bilateral) (R>L); Lumbar facet syndrome (Bilateral); Chronic musculoskeletal pain; Neurogenic pain; and Genu valgus, congenital on their problem list. Her primarily concern today is the Follow-up; Back Pain; and Leg Pain  Pain Assessment: Location: Lower, Left Back Radiating: Radiates from lower back to back of legs bilateral to bottom of feet; with a lot of walking pain affects toes  Onset: More than a month ago Duration: Chronic pain Quality: Constant, Aching Severity: 8 /10 (subjective, self-reported pain score)  Note: Reported level is inconsistent with clinical  observations. Clinically the patient looks like a 3/10 A 3/10 is viewed as "Moderate" and described as significantly interfering with activities of daily living (ADL). It becomes difficult to feed, bathe, get dressed, get on and off the toilet or to perform personal hygiene functions. Difficult to get in and out of bed or a chair without assistance. Very distracting. With effort, it can be ignored when deeply involved in activities. Information on the proper use of the pain scale provided to the patient today. When using our objective Pain Scale, levels between 6 and 10/10 are said to belong in an emergency room, as it progressively worsens from a 6/10, described as severely limiting, requiring emergency care not usually available at an outpatient pain management facility. At a 6/10 level, communication becomes difficult and requires great effort. Assistance to reach the emergency department may be required. Facial flushing and profuse sweating along with potentially dangerous increases in heart rate and blood pressure will be evident. Effect on ADL: "A lot of times I have to sit down at work because I push myself to far or other times my legs give out on me"  Timing: Constant Modifying factors: Rest and heat  BP: (!) 132/97  HR: 96  Dawn Frank comes in today for a follow-up visit after her initial evaluation on 07/10/2018. Today we went over the results of her tests. These were explained in "Layman's terms". During today's appointment we went over my diagnostic impression, as well as the proposed treatment plan.  According to the patient her primary area of pain is in her lower back.  She feels like the pain is just getting worse secondary to her cerebral palsy.  She denies any previous treatments of surgery or interventional therapy.  She had did have some physical  therapy in 2018 which did help a little.  She denies any recent images.  Second area of pain is in her legs (B) (R>L).  She admits that it  rotates around to the front of her legs down into the bottom of her feet.  She has numbness and tingling.  She feels like there is some weakness.  She denies a previous nerve conduction study. RLEP - down to bottom of foot, thru back of leg (Dermatomal S1). LLEP - down to the inside of foot, rotates (Dermatomal L4).  She also has left hand CTS, diagnosed by Dawn Frank via [employer]. No surgery or injections. Recomended brace. She refers having obtained that thru a TV offer.  In considering the treatment plan options, Dawn Frank was reminded that I no longer take patients for medication management only. I asked her to let me know if she had no intention of taking advantage of the interventional therapies, so that we could make arrangements to provide this space to someone interested. I also made it clear that undergoing interventional therapies for the purpose of getting pain medications is very inappropriate on the part of a patient, and it will not be tolerated in this practice. This type of behavior would suggest true addiction and therefore it requires referral to an addiction specialist.   Further details on both, my assessment(s), as well as the proposed treatment plan, please see below.  Controlled Substance Pharmacotherapy Assessment REMS (Risk Evaluation and Mitigation Strategy)  Analgesic: None Highest recorded MME/day: 20 mg/day MME/day: 0 mg/day  Pill Count: None expected due to no prior prescriptions written by our practice. Dawn Napoleon, RN  07/30/2018  9:53 AM  Sign when Signing Visit Safety precautions to be maintained throughout the outpatient stay will include: orient to surroundings, keep bed in low position, maintain call bell within reach at all times, provide assistance with transfer out of bed and ambulation.    Pharmacokinetics: Liberation and absorption (onset of action): WNL Distribution (time to peak effect): WNL Metabolism and excretion (duration of action): WNL          Pharmacodynamics: Desired effects: Analgesia: Dawn Frank reports >50% benefit. Functional ability: Patient reports that medication allows her to accomplish basic ADLs Clinically meaningful improvement in function (CMIF): Sustained CMIF goals met Perceived effectiveness: Described as relatively effective, allowing for increase in activities of daily living (ADL) Undesirable effects: Side-effects or Adverse reactions: None reported Monitoring: [place] PMP: Online review of the past 54-monthperiod previously conducted. Not applicable at this point since we have not taken over the patient's medication management yet. List of other Serum/Urine Drug Screening Test(s):  No results found. List of all UDS test(s) done:  Lab Results  Component Value Date   SUMMARY FINAL 07/10/2018   Last UDS on record: Summary  Date Value Ref Range Status  07/10/2018 FINAL  Final    Comment:    ==================================================================== TOXASSURE COMP DRUG ANALYSIS,UR ==================================================================== Test                             Result       Flag       Units Drug Present and Declared for Prescription Verification   Nortriptyline                  PRESENT      EXPECTED    Nortriptyline may be administered as a prescription drug; it is    also an expected  metabolite of amitriptyline.   Sertraline                     PRESENT      EXPECTED   Desmethylsertraline            PRESENT      EXPECTED    Desmethylsertraline is an expected metabolite of sertraline.   Acetaminophen                  PRESENT      EXPECTED Drug Present not Declared for Prescription Verification   Carboxy-THC                    60           UNEXPECTED ng/mg creat    Carboxy-THC is a metabolite of tetrahydrocannabinol  (THC).    Source of [facility] is most commonly illicit, but THC is also present    in a scheduled prescription medication.   Pregabalin                     PRESENT       UNEXPECTED   Ibuprofen                      PRESENT      UNEXPECTED Drug Absent but Declared for Prescription Verification   Codeine                        Not Detected UNEXPECTED ng/mg creat   Hydrocodone                    Not Detected UNEXPECTED ng/mg creat   Cyclobenzaprine                Not Detected UNEXPECTED   Naproxen                       Not Detected UNEXPECTED   Chlorpheniramine               Not Detected UNEXPECTED   Guaifenesin                    Not Detected UNEXPECTED ==================================================================== Test                      Result    Flag   Units      Ref Range   Creatinine              126              mg/dL      >=20 ==================================================================== Declared Medications:  The flagging and interpretation on this report are based on the  following declared medications.  Unexpected results may arise from  inaccuracies in the declared medications.  **Note: The testing scope of this panel includes these medications:  Chlorpheniramine (Tussionex)  Codeine  Cyclobenzaprine  Guaifenesin  Hydrocodone (Hydrocodone-Acetaminophen)  Hydrocodone (Tussionex)  Naproxen  Nortriptyline  Sertraline  **Note: The testing scope of this panel does not include small to  moderate amounts of these reported medications:  Acetaminophen (Hydrocodone-Acetaminophen)  **Note: The testing scope of this panel does not include following  reported medications:  Azithromycin  Hydrochlorothiazide  Lisinopril  Ondansetron  Vitamin C ==================================================================== For clinical consultation, please call [phone]. ====================================================================    UDS interpretation: Unexpected findings: Undeclared illicit substance detected Medication Assessment Form: Not  applicable. No opioids. Treatment compliance: Not applicable Risk Assessment  Profile: Aberrant behavior: See initial evaluations. None observed or detected today Comorbid factors increasing risk of overdose: See initial evaluation. No additional risks detected today Opioid risk tool (ORT):  Opioid Risk  07/30/2018  Alcohol 0  Illegal Drugs 0  Rx Drugs 0  Alcohol 0  Illegal Drugs 0  Rx Drugs 0  Age between 16-45 years  1  History of Preadolescent Sexual Abuse 0  Psychological Disease -  Depression 1  Opioid Risk Tool Scoring 2  Opioid Risk Interpretation Low Risk    ORT Scoring interpretation table:  Score <3 = Low Risk for SUD  Score between 4-7 = Moderate Risk for SUD  Score >8 = High Risk for Opioid Abuse   Risk of substance use disorder (SUD): Low  Risk Mitigation Strategies:  Patient opioid safety counseling: No controlled substances prescribed. Patient-Prescriber Agreement (PPA): No agreement signed.  Controlled substance notification to other providers: None required. No opioid therapy.  Pharmacologic Plan: No opioid analgesic prescribed.             Laboratory Chemistry  Inflammation Markers (CRP: Acute Phase) (ESR: Chronic Phase) Lab Results  Component Value Date   CRP <1 07/10/2018   ESRSEDRATE 14 07/10/2018                         Rheumatology Markers Lab Results  Component Value Date   LABURIC 3.7 07/10/2018                        Renal Function Markers Lab Results  Component Value Date   BUN 10 07/10/2018   CREATININE 0.83 07/10/2018   BCR 12 07/10/2018   GFRAA 109 07/10/2018   GFRNONAA 94 07/10/2018                             Hepatic Function Markers Lab Results  Component Value Date   AST 22 07/10/2018   ALT 32 05/12/2016   ALBUMIN 4.3 07/10/2018   ALKPHOS 88 07/10/2018   LIPASE 28 05/12/2016                        Electrolytes Lab Results  Component Value Date   NA 139 07/10/2018   K 3.8 07/10/2018   CL 100 07/10/2018   CALCIUM 9.8 07/10/2018   MG 1.9 07/10/2018                        Neuropathy  Markers Lab Results  Component Value Date   [id number] 937 07/10/2018                        CNS Tests No results found.  Bone Pathology Markers Lab Results  Component Value Date   25OHVITD1 13 (L) 07/10/2018   25OHVITD2 <1.0 07/10/2018   25OHVITD3 13 07/10/2018                         Coagulation Parameters Lab Results  Component Value Date   PLT 231 05/12/2016                        Cardiovascular Markers Lab Results  Component Value Date   HGB 12.0 05/12/2016   HCT 37.6 05/12/2016  CA Markers No results found.  Endocrine Markers No results found.  Note: Lab results reviewed.  Recent Diagnostic Imaging Review  Lumbosacral Imaging: Lumbar MR wo contrast:  Results for orders placed during the hospital encounter of 07/21/16  MR LUMBAR SPINE WO CONTRAST   Narrative CLINICAL DATA:  33 year old female with chronic lumbar back pain, increased recently following bronchitis with coughing. Initial encounter.  EXAM: MRI LUMBAR SPINE WITHOUT CONTRAST  TECHNIQUE: Multiplanar, multisequence MR imaging of the lumbar spine was performed. No intravenous contrast was administered.  COMPARISON:  Lumbar radiographs 08/15/2015.  FINDINGS: Segmentation: Normal as seen on the comparison radiographs, although there is a vestigial S1-S2 disc space.  Alignment: Stable preserved lumbar lordosis. Mildly exaggerated lordosis with trace retrolisthesis at L5-S1.  Vertebrae: No marrow edema or evidence of acute osseous abnormality. Visualized bone marrow signal is within normal limits. Negative visualized sacrum and SI joints.  Conus medullaris: Extends to the L1-L2 level and appears normal.  Paraspinal and other soft tissues: Negative.  Disc levels:  T11-T12:  Negative.  T12-L1:  Mild facet hypertrophy, otherwise negative.  L1-L2:  Negative.  L2-L3:  Negative.  L3-L4:  Mild right facet hypertrophy, otherwise negative.  L4-L5:  Mild right  facet hypertrophy, otherwise negative.  L5-S1:  Mild bilateral facet hypertrophy, otherwise negative.  IMPRESSION: Mild lumbar facet degeneration, but otherwise normal MRI appearance of the lumbar spine.  No acute osseous abnormality and no disc herniation or neural impingement.   Electronically Signed   By: Genevie Ann M.D.   On: 07/21/2016 10:28    Lumbar DG 2-3 views:  Results for orders placed during the hospital encounter of 08/15/15  DG Lumbar Spine 2-3 Views   Narrative CLINICAL DATA:  Persistent low back pain radiating to the bilateral thighs. History of cerebral palsy.  EXAM: LUMBAR SPINE - 2-3 VIEW  COMPARISON:  01/28/2013  FINDINGS: There are 5 non rib-bearing lumbar type vertebral bodies.  Normal alignment of the lumbar spine. No anterolisthesis or retrolisthesis.  Lumbar vertebral body heights are preserved.  Intervertebral disc space heights are preserved.  Limited visualization of the bilateral SI joints is normal.  Regional bowel gas pattern is normal.  IMPRESSION: No explanation for patient's chronic back pain.   Electronically Signed   By: Sandi Mariscal M.D.   On: 08/15/2015 14:11    Lumbar DG Bending views:  Results for orders placed during the hospital encounter of 07/16/18  DG Lumbar Spine Complete W/Bend   Narrative CLINICAL DATA:  Lumbago with radicular symptoms  EXAM: LUMBAR SPINE - COMPLETE WITH BENDING VIEWS  COMPARISON:  Lumbar MRI July 21, 2016  FINDINGS: Standing frontal, standing neutral lateral, standing flexion lateral, standing extension lateral, spot lumbosacral lateral, and bilateral oblique views were obtained. There are 5 non-rib-bearing lumbar type vertebral bodies. There is lumbar levoscoliosis with rotatory component. There is no evident fracture. There is no appreciable spondylolisthesis on neutral lateral imaging. There is no change in lateral alignment with flexion and extension. Disc spaces appear  unremarkable. There is no appreciable facet arthropathy on the oblique views.  IMPRESSION: Levoscoliosis with rotatory component. No fracture. No spondylolisthesis. No change in lateral alignment between neutral lateral, flexion lateral, and extension lateral positioning. No appreciable underlying arthropathic change.   Electronically Signed   By: Lowella Grip III M.D.   On: 07/16/2018 14:04    Complexity Note: Imaging results reviewed. Results shared with Ms. Hendler, using [employer].  Meds   Current Outpatient Medications:  .  Ascorbic Acid (VITAMIN C) 1000 MG tablet, Take 1,000 mg by mouth daily., Disp: , Rfl:  .  azithromycin (ZITHROMAX Z-PAK) 250 MG tablet, Take 2 tablets (500 mg) on  Day 1,  followed by 1 tablet (250 mg) once daily on Days 2 through 5. (Patient not taking: Reported on 07/10/2018), Disp: 6 each, Rfl: 0 .  chlorpheniramine-HYDROcodone (TUSSIONEX PENNKINETIC ER) 10-8 MG/5ML SUER, Take 5 mLs by mouth 2 (two) times daily. (Patient not taking: Reported on 07/10/2018), Disp: 140 mL, Rfl: 0 .  cyclobenzaprine (FLEXERIL) 10 MG tablet, Take 10 mg by mouth at bedtime., Disp: , Rfl:  .  guaiFENesin-codeine 100-10 MG/5ML syrup, Take 10 mLs by mouth every 4 (four) hours as needed for cough. (Patient not taking: Reported on 07/10/2018), Disp: 180 mL, Rfl: 0 .  hydrochlorothiazide (HYDRODIURIL) 12.5 MG tablet, Take 12.5 mg by mouth daily., Disp: , Rfl:  .  HYDROcodone-acetaminophen (NORCO) 5-325 MG tablet, Take 1-2 tablets by mouth every 4 (four) hours as needed for moderate pain. (Patient not taking: Reported on 07/10/2018), Disp: 10 tablet, Rfl: 0 .  lisinopril (PRINIVIL,ZESTRIL) 10 MG tablet, Take 10 mg by mouth daily., Disp: , Rfl:  .  naproxen (NAPROSYN) 500 MG tablet, Take 1 tablet (500 mg total) by mouth 2 (two) times daily with a meal., Disp: 60 tablet, Rfl: 0 .  nortriptyline (PAMELOR) 10 MG capsule, Take 10 mg by mouth at bedtime., Disp: ,  Rfl:  .  ondansetron (ZOFRAN) 4 MG tablet, Take 1 tablet (4 mg total) by mouth daily as needed for nausea or vomiting. (Patient not taking: Reported on 07/10/2018), Disp: 20 tablet, Rfl: 1 .  sertraline (ZOLOFT) 50 MG tablet, Take 50 mg by mouth daily., Disp: , Rfl:   ROS  Constitutional: Denies any fever or chills Gastrointestinal: No reported hemesis, hematochezia, vomiting, or acute GI distress Musculoskeletal: Denies any acute onset joint swelling, redness, loss of ROM, or weakness Neurological: No reported episodes of acute onset apraxia, aphasia, dysarthria, agnosia, amnesia, paralysis, loss of coordination, or loss of consciousness  Allergies  Ms. Bergstresser has No Known Allergies.  [place]  Drug: Ms. Reyburn  reports no history of drug use. Alcohol:  reports no history of alcohol use. Tobacco:  reports that she has been smoking cigarettes. She has a 3.00 pack-year smoking history. She has never used smokeless tobacco. Medical:  has a past medical history of Cerebral palsy ([place]), Chronic back pain, and Hypertension. Surgical: Ms. Addis  has no past surgical history on file. Family: family history is not on file.  Constitutional Exam  General appearance: Well nourished, well developed, and well hydrated. In no apparent acute distress Vitals:   07/30/18 0946  BP: (!) 132/97  Pulse: 96  Temp: (!) 97.5 F (36.4 C)  SpO2: 100%  Weight: 184 lb (83.5 kg)  Height: _0  (1.575 m)   BMI Assessment: Estimated body mass index is 33.65 kg/m as calculated from the following:   Height as of this encounter: _1  (1.575 m).   Weight as of this encounter: 184 lb (83.5 kg).  BMI interpretation table: BMI level Category Range association with higher incidence of chronic pain  <18 kg/m2 Underweight   18.5-24.9 kg/m2 Ideal body weight   25-29.9 kg/m2 Overweight Increased incidence by 20%  30-34.9 kg/m2 Obese (Class I) Increased incidence by 68%  35-39.9 kg/m2 Severe obesity (Class II)  Increased incidence by 136%  >40 kg/m2 Extreme obesity (Class III) Increased  incidence by 254%   Patient's current BMI Ideal Body weight  Body mass index is 33.65 kg/m. Ideal body weight: 50.1 kg (110 lb 7.2 oz) Adjusted ideal body weight: 63.4 kg (139 lb 13.9 oz)   BMI Readings from Last 4 Encounters:  07/30/18 33.65 kg/m  07/10/18 34.02 kg/m  05/12/16 37.49 kg/m  08/15/15 38.59 kg/m   Wt Readings from Last 4 Encounters:  07/30/18 184 lb (83.5 kg)  07/10/18 186 lb (84.4 kg)  05/12/16 205 lb (93 kg)  08/15/15 211 lb (95.7 kg)  Psych/Mental status: Alert, oriented x 3 (person, place, & time)       Eyes: PERLA Respiratory: No evidence of acute respiratory distress  Cervical Spine Area Exam  Skin & Axial Inspection: No masses, redness, edema, swelling, or associated skin lesions Alignment: Symmetrical Functional ROM: Unrestricted ROM      Stability: No instability detected Muscle Tone/Strength: Functionally intact. No obvious neuro-muscular anomalies detected. Sensory (Neurological): Unimpaired Palpation: No palpable anomalies              Upper Extremity (UE) Exam    Side: Right upper extremity  Side: Left upper extremity  Skin & Extremity Inspection: Skin color, temperature, and hair growth are WNL. No peripheral edema or cyanosis. No masses, redness, swelling, asymmetry, or associated skin lesions. No contractures.  Skin & Extremity Inspection: Skin color, temperature, and hair growth are WNL. No peripheral edema or cyanosis. No masses, redness, swelling, asymmetry, or associated skin lesions. No contractures.  Functional ROM: Unrestricted ROM          Functional ROM: Unrestricted ROM          Muscle Tone/Strength: Functionally intact. No obvious neuro-muscular anomalies detected.  Muscle Tone/Strength: Functionally intact. No obvious neuro-muscular anomalies detected.  Sensory (Neurological): Unimpaired          Sensory (Neurological): Unimpaired          Palpation: No  palpable anomalies              Palpation: No palpable anomalies              Provocative Test(s):  Phalen's test: deferred Tinel's test: deferred Apley's scratch test (touch opposite shoulder):  Action 1 (Across chest): deferred Action 2 (Overhead): deferred Action 3 (LB reach): deferred   Provocative Test(s):  Phalen's test: deferred Tinel's test: deferred Apley's scratch test (touch opposite shoulder):  Action 1 (Across chest): deferred Action 2 (Overhead): deferred Action 3 (LB reach): deferred    Thoracic Spine Area Exam  Skin & Axial Inspection: No masses, redness, or swelling Alignment: Symmetrical Functional ROM: Unrestricted ROM Stability: No instability detected Muscle Tone/Strength: Functionally intact. No obvious neuro-muscular anomalies detected. Sensory (Neurological): Unimpaired Muscle strength & Tone: No palpable anomalies  Lumbar Spine Area Exam  Skin & Axial Inspection: No masses, redness, or swelling Alignment: Symmetrical Functional ROM: Decreased ROM affecting both sides Stability: No instability detected Muscle Tone/Strength: Inconsistent level of performance when tested Sensory (Neurological): Movement-associated pain Palpation: Complains of area being tender to palpation       Provocative Tests: Hyperextension/rotation test: (+) bilaterally for facet joint pain. Lumbar quadrant test (Kemp's test): (+) bilaterally for facet joint pain. Lateral bending test: deferred today       Patrick's Maneuver: (+) for bilateral S-I arthralgia and for bilateral hip arthralgia FABER* test: (+) for bilateral S-I arthralgia and for bilateral hip arthralgia S-I anterior distraction/compression test: deferred today         S-I lateral compression test: deferred today  S-I Thigh-thrust test: deferred today         S-I Gaenslen's test: deferred today         *(Flexion, ABduction and External Rotation)  Gait & Posture Assessment  Ambulation: Unassisted. Genu  valgus deformity. Gait: Awkward secondary to the genu valgus deformity. Posture: Antalgic   Lower Extremity Exam    Side: Right lower extremity  Side: Left lower extremity  Stability: No instability observed          Stability: No instability observed          Skin & Extremity Inspection: Valgus (knock knee) deformity  Skin & Extremity Inspection: Valgus (knock knee) deformity  Functional ROM: Decreased ROM for hip joint          Functional ROM: Decreased ROM for hip joint          Muscle Tone/Strength: Able to Toe-walk & Heel-walk without problems  Muscle Tone/Strength: Able to Toe-walk & Heel-walk without problems  Sensory (Neurological): Movement-associated pain hip joint  Sensory (Neurological): Arthropathic arthralgia hip joint  DTR: Patellar: deferred today Achilles: deferred today Plantar: deferred today  DTR: Patellar: deferred today Achilles: deferred today Plantar: deferred today  Palpation: No palpable anomalies  Palpation: No palpable anomalies   Assessment & Plan  Primary Diagnosis & Pertinent Problem List: The primary encounter diagnosis was Chronic pain syndrome. Diagnoses of Chronic low back pain (Primary area of Pain)  (Bilateral) (R>L) w/o sciatica, Chronic lower extremity pain (Secondary Area of Pain) (Bilateral) (R>L), Lumbar facet syndrome (Bilateral), Facet hypertrophy of lumbar region, Chronic sacroiliac joint pain (B), Numbness and tingling, Bilateral hand pain, Right carpal tunnel syndrome, Chronic pain of both hips, Cerebral palsy, diplegic (HCC), Chronic musculoskeletal pain, Neurogenic pain, Levoscoliosis, Genu valgus, congenital, Vitamin D deficiency, and Marijuana use were also pertinent to this visit.  Visit Diagnosis: 1. Chronic pain syndrome   2. Chronic low back pain (Primary area of Pain)  (Bilateral) (R>L) w/o sciatica   3. Chronic lower extremity pain (Secondary Area of Pain) (Bilateral) (R>L)   4. Lumbar facet syndrome (Bilateral)   5. Facet  hypertrophy of lumbar region   6. Chronic sacroiliac joint pain (B)   7. Numbness and tingling   8. Bilateral hand pain   9. Right carpal tunnel syndrome   10. Chronic pain of both hips   11. Cerebral palsy, diplegic ([place])   12. Chronic musculoskeletal pain   13. Neurogenic pain   14. Levoscoliosis   15. Genu valgus, congenital   16. Vitamin D deficiency   17. Marijuana use    Problems updated and reviewed during this visit: Problem  Lumbar facet hypertrophy (Multilevel)  Levoscoliosis  Chronic hip pain (Bilateral) (L>R)  Chronic sacroiliac joint pain (Bilateral) (R>L)  Lumbar facet syndrome (Bilateral)  Chronic Musculoskeletal Pain  Neurogenic Pain  Genu Valgus, Congenital  Chronic Pain Syndrome  Chronic low back pain (Primary area of Pain) (Bilateral) (R>L) w/ sciatica (Bilateral)  Chronic lower extremity pain (Secondary Area of Pain) (Bilateral) (R>L)  Bilateral Hand Pain  Numbness and Tingling  Carpal tunnel syndrome (CTS) (Right)  Chronic low back pain (Primary area of Pain)  (Bilateral) (R>L) w/o sciatica  Cerebral Palsy, Diplegic (Hcc)   Diplegia is a condition that causes stiffness, weakness, or lack of mobility in muscle groups on both sides of the body. This usually involves the legs, but in some people the arms and face also might be affected.   Vitamin D Deficiency  Marijuana Use  Pharmacologic Therapy  Disorder of  Skeletal System  Problems Influencing Health Status  Cerebral Palsy (Hcc)    Plan of Care  Pharmacotherapy (Medications Ordered): No orders of the defined types were placed in this encounter.   Procedure Orders     LUMBAR FACET(MEDIAL BRANCH NERVE BLOCK) MBNB Lab Orders  No laboratory test(s) ordered today    Imaging Orders     DG Si Joints     DG HIP UNILAT W OR W/O PELVIS 2-3 VIEWS RIGHT     DG HIP UNILAT W OR W/O PELVIS 2-3 VIEWS LEFT Referral Orders  No referral(s) requested today    Pharmacological management options:  Opioid  Analgesics: I will not be prescribing any opioids at this time.  Membrane stabilizer: We have discussed the possibility of optimizing this mode of therapy, if tolerated Muscle relaxant: We have discussed the possibility of a trial NSAID: We have discussed the possibility of a trial Other analgesic(s): To be determined at a later time   Interventional management options: Planned, scheduled, and/or pending:    Diagnostic bilateral lumbar facet nerve block #1 under fluoroscopic guidance and IV sedation    Considering:   Diagnostic bilateral lumbar facet block  Possible bilateral lumbar facet RFA  Diagnostic bilateral sacroiliac joint block  Possible bilateral sacroiliac joint RFA  Diagnostic bilateral intra-articular hip joint injection  Diagnostic bilateral femoral nerve + obturator nerve block  Possible bilateral femoral nerve + obturator nerve RFA  Diagnostic bilateral L5 transforaminal LESI  Diagnostic bilateral S1 transforaminal ESI  Diagnostic midline caudal ESI + diagnostic epidurogram  Diagnostic midline L4-5 LESI    PRN Procedures:   None at this time   Provider-requested follow-up: Return for PRN Procedure: (B) L-FCT BLK #1 (w/ Sedation).  Future Appointments  Date Time Provider [place]  08/08/2018  1:45 PM Shelton Silvas, PT [facility] None  08/13/2018  1:45 PM Shelton Silvas, PT [facility] None  08/15/2018  1:00 PM Shelton Silvas, PT [facility] None  08/21/2018  1:45 PM Shelton Silvas, PT [facility] None  08/22/2018  1:00 PM Shelton Silvas, PT [facility] None  08/27/2018  1:45 PM Shelton Silvas, PT [facility] None  08/29/2018  1:00 PM Shelton Silvas, PT [facility] None  09/03/2018  1:45 PM Shelton Silvas, PT [facility] None  09/05/2018  1:00 PM Shelton Silvas, PT [facility] None  09/10/2018  1:00 PM Shelton Silvas, PT [facility] None  09/12/2018  1:00 PM Shelton Silvas, PT [facility] None    Primary Care Physician: [facility] [place] Location: [facility] [place] [facility] Note by: Gaspar Cola, MD Date: 07/30/2018; Time: 11:28 AM

## 2018-07-30 ENCOUNTER — Ambulatory Visit
Admission: RE | Admit: 2018-07-30 | Discharge: 2018-07-30 | Disposition: A | Discharge status: Home / Self Care | Payer: Medicare Other | Source: Ambulatory Visit | Attending: Pain Medicine | Admitting: Pain Medicine

## 2018-07-30 ENCOUNTER — Other Ambulatory Visit: Payer: Self-pay

## 2018-07-30 ENCOUNTER — Encounter: Payer: Self-pay | Admitting: Pain Medicine

## 2018-07-30 ENCOUNTER — Ambulatory Visit (HOSPITAL_BASED_OUTPATIENT_CLINIC_OR_DEPARTMENT_OTHER): Payer: Medicare Other | Admitting: Pain Medicine

## 2018-07-30 VITALS — BP 132/97 | HR 96 | Temp 97.5°F | Ht 62.0 in | Wt 184.0 lb

## 2018-07-30 DIAGNOSIS — G8929 Other chronic pain: Secondary | ICD-10-CM | POA: Insufficient documentation

## 2018-07-30 DIAGNOSIS — M4180 Other forms of scoliosis, site unspecified: Secondary | ICD-10-CM | POA: Insufficient documentation

## 2018-07-30 DIAGNOSIS — M533 Sacrococcygeal disorders, not elsewhere classified: Secondary | ICD-10-CM

## 2018-07-30 DIAGNOSIS — F1290 Cannabis use, unspecified, uncomplicated: Secondary | ICD-10-CM | POA: Insufficient documentation

## 2018-07-30 DIAGNOSIS — R202 Paresthesia of skin: Secondary | ICD-10-CM | POA: Insufficient documentation

## 2018-07-30 DIAGNOSIS — M25551 Pain in right hip: Secondary | ICD-10-CM

## 2018-07-30 DIAGNOSIS — E559 Vitamin D deficiency, unspecified: Secondary | ICD-10-CM | POA: Insufficient documentation

## 2018-07-30 DIAGNOSIS — M7918 Myalgia, other site: Secondary | ICD-10-CM | POA: Insufficient documentation

## 2018-07-30 DIAGNOSIS — G894 Chronic pain syndrome: Secondary | ICD-10-CM

## 2018-07-30 DIAGNOSIS — M79604 Pain in right leg: Secondary | ICD-10-CM | POA: Insufficient documentation

## 2018-07-30 DIAGNOSIS — M25552 Pain in left hip: Secondary | ICD-10-CM | POA: Diagnosis present

## 2018-07-30 DIAGNOSIS — M545 Low back pain, unspecified: Secondary | ICD-10-CM

## 2018-07-30 DIAGNOSIS — M792 Neuralgia and neuritis, unspecified: Secondary | ICD-10-CM | POA: Insufficient documentation

## 2018-07-30 DIAGNOSIS — Q741 Congenital malformation of knee: Secondary | ICD-10-CM | POA: Insufficient documentation

## 2018-07-30 DIAGNOSIS — M79641 Pain in right hand: Secondary | ICD-10-CM

## 2018-07-30 DIAGNOSIS — M79642 Pain in left hand: Secondary | ICD-10-CM | POA: Insufficient documentation

## 2018-07-30 DIAGNOSIS — M47816 Spondylosis without myelopathy or radiculopathy, lumbar region: Secondary | ICD-10-CM | POA: Insufficient documentation

## 2018-07-30 DIAGNOSIS — G808 Other cerebral palsy: Secondary | ICD-10-CM

## 2018-07-30 DIAGNOSIS — R200 Anesthesia of skin: Secondary | ICD-10-CM

## 2018-07-30 DIAGNOSIS — M79605 Pain in left leg: Secondary | ICD-10-CM

## 2018-07-30 DIAGNOSIS — G5601 Carpal tunnel syndrome, right upper limb: Secondary | ICD-10-CM

## 2018-07-30 NOTE — Progress Notes (Signed)
Safety precautions to be maintained throughout the outpatient stay will include: orient to surroundings, keep bed in low position, maintain call bell within reach at all times, provide assistance with transfer out of bed and ambulation.  

## 2018-07-30 NOTE — Patient Instructions (Addendum)
______________________________________________________________________________________________  Specialty Pain Scale  Introduction:  There are significant differences in how pain is reported. The word pain usually refers to physical pain, but it is also a common synonym of suffering. The medical community uses a scale from 0 (zero) to 10 (ten) to report pain level. Zero (0) is described as "no pain", while ten (10) is described as "the worse pain you can imagine". The problem with this scale is that physical pain is reported along with suffering. Suffering refers to mental pain, or more often yet it refers to any unpleasant feeling, emotion or aversion associated with the perception of harm or threat of harm. It is the psychological component of pain.  Pain Specialists prefer to separate the two components. The pain scale used by this practice is the Verbal Numerical Rating Scale (VNRS-11). This scale is for the physical pain only. DO NOT INCLUDE how your pain psychologically affects you. This scale is for adults 21 years of age and older. It has 11 (eleven) levels. The 1st level is 0/10. This means: "right now, I have no pain". In the context of pain management, it also means: "right now, my physical pain is under control with the current therapy".  General Information:  The scale should reflect your current level of pain. Unless you are specifically asked for the level of your worst pain, or your average pain. If you are asked for one of these two, then it should be understood that it is over the past 24 hours.  Levels 1 (one) through 5 (five) are described below, and can be treated as an outpatient. Ambulatory pain management facilities such as ours are more than adequate to treat these levels. Levels 6 (six) through 10 (ten) are also described below, however, these must be treated as a hospitalized patient. While levels 6 (six) and 7 (seven) may be evaluated at an urgent care facility, levels 8  (eight) through 10 (ten) constitute medical emergencies and as such, they belong in a hospital's emergency department. When having these levels (as described below), do not come to our office. Our facility is not equipped to manage these levels. Go directly to an urgent care facility or an emergency department to be evaluated.  Definitions:  Activities of Daily Living (ADL): Activities of daily living (ADL or ADLs) is a term used in healthcare to refer to people's daily self-care activities. Health professionals often use a person's ability or inability to perform ADLs as a measurement of their functional status, particularly in regard to people post injury, with disabilities and the elderly. There are two ADL levels: Basic and Instrumental. Basic Activities of Daily Living (BADL  or BADLs) consist of self-care tasks that include: Bathing and showering; personal hygiene and grooming (including brushing/combing/styling hair); dressing; Toilet hygiene (getting to the toilet, cleaning oneself, and getting back up); eating and self-feeding (not including cooking or chewing and swallowing); functional mobility, often referred to as "transferring", as measured by the ability to walk, get in and out of bed, and get into and out of a chair; the broader definition (moving from one place to another while performing activities) is useful for people with different physical abilities who are still able to get around independently. Basic ADLs include the things many people do when they get up in the morning and get ready to go out of the house: get out of bed, go to the toilet, bathe, dress, groom, and eat. On the average, loss of function typically follows a particular order.   Hygiene is the first to go, followed by loss of toilet use and locomotion. The last to go is the ability to eat. When there is only one remaining area in which the person is independent, there is a 62.9% chance that it is eating and only a 3.5% chance  that it is hygiene. Instrumental Activities of Daily Living (IADL or IADLs) are not necessary for fundamental functioning, but they let an individual live independently in a community. IADL consist of tasks that include: cleaning and maintaining the house; home establishment and maintenance; care of others (including selecting and supervising caregivers); care of pets; child rearing; managing money; managing financials (investments, etc.); meal preparation and cleanup; shopping for groceries and necessities; moving within the community; safety procedures and emergency responses; health management and maintenance (taking prescribed medications); and using the telephone or other form of communication.  Instructions:  Most patients tend to report their pain as a combination of two factors, their physical pain and their psychosocial pain. This last one is also known as "suffering" and it is reflection of how physical pain affects you socially and psychologically. From now on, report them separately.  From this point on, when asked to report your pain level, report only your physical pain. Use the following table for reference.  Pain Clinic Pain Levels (0-5/10)  Pain Level Score  Description  No Pain 0   Mild pain 1 Nagging, annoying, but does not interfere with basic activities of daily living (ADL). Patients are able to eat, bathe, get dressed, toileting (being able to get on and off the toilet and perform personal hygiene functions), transfer (move in and out of bed or a chair without assistance), and maintain continence (able to control bladder and bowel functions). Blood pressure and heart rate are unaffected. A normal heart rate for a healthy adult ranges from 60 to 100 bpm (beats per minute).   Mild to moderate pain 2 Noticeable and distracting. Impossible to hide from other people. More frequent flare-ups. Still possible to adapt and function close to normal. It can be very annoying and may have  occasional stronger flare-ups. With discipline, patients may get used to it and adapt.   Moderate pain 3 Interferes significantly with activities of daily living (ADL). It becomes difficult to feed, bathe, get dressed, get on and off the toilet or to perform personal hygiene functions. Difficult to get in and out of bed or a chair without assistance. Very distracting. With effort, it can be ignored when deeply involved in activities.   Moderately severe pain 4 Impossible to ignore for more than a few minutes. With effort, patients may still be able to manage work or participate in some social activities. Very difficult to concentrate. Signs of autonomic nervous system discharge are evident: dilated pupils (mydriasis); mild sweating (diaphoresis); sleep interference. Heart rate becomes elevated (>115 bpm). Diastolic blood pressure (lower number) rises above 100 mmHg. Patients find relief in laying down and not moving.   Severe pain 5 Intense and extremely unpleasant. Associated with frowning face and frequent crying. Pain overwhelms the senses.  Ability to do any activity or maintain social relationships becomes significantly limited. Conversation becomes difficult. Pacing back and forth is common, as getting into a comfortable position is nearly impossible. Pain wakes you up from deep sleep. Physical signs will be obvious: pupillary dilation; increased sweating; goosebumps; brisk reflexes; cold, clammy hands and feet; nausea, vomiting or dry heaves; loss of appetite; significant sleep disturbance with inability to fall asleep or to   remain asleep. When persistent, significant weight loss is observed due to the complete loss of appetite and sleep deprivation.  Blood pressure and heart rate becomes significantly elevated. Caution: If elevated blood pressure triggers a pounding headache associated with blurred vision, then the patient should immediately seek attention at an urgent or emergency care unit, as  these may be signs of an impending stroke.    Emergency Department Pain Levels (6-10/10)  Emergency Room Pain 6 Severely limiting. Requires emergency care and should not be seen or managed at an outpatient pain management facility. Communication becomes difficult and requires great effort. Assistance to reach the emergency department may be required. Facial flushing and profuse sweating along with potentially dangerous increases in heart rate and blood pressure will be evident.   Distressing pain 7 Self-care is very difficult. Assistance is required to transport, or use restroom. Assistance to reach the emergency department will be required. Tasks requiring coordination, such as bathing and getting dressed become very difficult.   Disabling pain 8 Self-care is no longer possible. At this level, pain is disabling. The individual is unable to do even the most "basic" activities such as walking, eating, bathing, dressing, transferring to a bed, or toileting. Fine motor skills are lost. It is difficult to think clearly.   Incapacitating pain 9 Pain becomes incapacitating. Thought processing is no longer possible. Difficult to remember your own name. Control of movement and coordination are lost.   The worst pain imaginable 10 At this level, most patients pass out from pain. When this level is reached, collapse of the autonomic nervous system occurs, leading to a sudden drop in blood pressure and heart rate. This in turn results in a temporary and dramatic drop in blood flow to the brain, leading to a loss of consciousness. Fainting is one of the body's self defense mechanisms. Passing out puts the brain in a calmed state and causes it to shut down for a while, in order to begin the healing process.    Summary: 1. Refer to this scale when providing [place] with your pain level. 2. Be accurate and careful when reporting your pain level. This will help with your care. 3. Over-reporting your pain level will  lead to loss of credibility. 4. Even a level of 1/10 means that there is pain and will be treated at our facility. 5. High, inaccurate reporting will be documented as "Symptom Exaggeration", leading to loss of credibility and suspicions of possible secondary gains such as obtaining more narcotics, or wanting to appear disabled, for fraudulent reasons. 6. Only pain levels of 5 or below will be seen at our facility. 7. Pain levels of 6 and above will be sent to the Emergency Department and the appointment cancelled. ______________________________________________________________________________________________    ____________________________________________________________________________________________  Preparing for Procedure with Sedation  Instructions: . Oral Intake: Do not eat or drink anything for at least 8 hours prior to your procedure. . Transportation: Public transportation is not allowed. Bring an adult driver. The driver must be physically present in our waiting room before any procedure can be started. Marland Kitchen Physical Assistance: Bring an adult physically capable of assisting you, in the event you need help. This adult should keep you company at home for at least 6 hours after the procedure. . Blood Pressure Medicine: Take your blood pressure medicine with a sip of water the morning of the procedure. . Blood thinners: Notify our staff if you are taking any blood thinners. Depending on which one you take, there will  be specific instructions on how and when to stop it. . Diabetics on insulin: Notify the staff so that you can be scheduled 1st case in the morning. If your diabetes requires high dose insulin, take only  of your normal insulin dose the morning of the procedure and notify the staff that you have done so. . Preventing infections: Shower with an antibacterial soap the morning of your procedure. . Build-up your immune system: Take 1000 mg of Vitamin C with every meal (3 times a  day) the day prior to your procedure. Marland Kitchen Antibiotics: Inform the staff if you have a condition or reason that requires you to take antibiotics before dental procedures. . Pregnancy: If you are pregnant, call and cancel the procedure. . Sickness: If you have a cold, fever, or any active infections, call and cancel the procedure. . Arrival: You must be in the facility at least 30 minutes prior to your scheduled procedure. . Children: Do not bring children with you. . Dress appropriately: Bring dark clothing that you would not mind if they get stained. . Valuables: Do not bring any jewelry or valuables.  Procedure appointments are reserved for interventional treatments only. Marland Kitchen No Prescription Refills. . No medication changes will be discussed during procedure appointments. . No disability issues will be discussed.  Reasons to call and reschedule or cancel your procedure: (Following these recommendations will minimize the risk of a serious complication.) . Surgeries: Avoid having procedures within 2 weeks of any surgery. (Avoid for 2 weeks before or after any surgery). . Flu Shots: Avoid having procedures within 2 weeks of a flu shots or . (Avoid for 2 weeks before or after immunizations). . Barium: Avoid having a procedure within 7-10 days after having had a radiological study involving the use of radiological contrast. (Myelograms, Barium swallow or enema study). . Heart attacks: Avoid any elective procedures or surgeries for the initial 6 months after a "Myocardial Infarction" (Heart Attack). . Blood thinners: It is imperative that you stop these medications before procedures. Let us know if you if you take any blood thinner.  . Infection: Avoid procedures during or within two weeks of an infection (including chest colds or gastrointestinal problems). Symptoms associated with infections include: Localized redness, fever, chills, night sweats or profuse sweating, burning sensation when voiding,  cough, congestion, stuffiness, runny nose, sore throat, diarrhea, nausea, vomiting, cold or Flu symptoms, recent or current infections. It is specially important if the infection is over the area that we intend to treat. Marland Kitchen Heart and lung problems: Symptoms that may suggest an active cardiopulmonary problem include: cough, chest pain, breathing difficulties or shortness of breath, dizziness, ankle swelling, uncontrolled high or unusually low blood pressure, and/or palpitations. If you are experiencing any of these symptoms, cancel your procedure and contact your primary care physician for an evaluation.  Remember:  Regular Business hours are:  Monday to Thursday 8:00 AM to 4:00 PM  Provider's Schedule: Delano Metz, MD:  Procedure days: Tuesday and Thursday 7:30 AM to 4:00 PM  Edward Jolly, MD:  Procedure days: Monday and Wednesday 7:30 AM to 4:00 PM ____________________________________________________________________________________________   ____________________________________________________________________________________________  Medication Rules  Purpose: To inform patients, and their family members, of our rules and regulations.  Applies to: All patients receiving prescriptions (written or electronic).  Pharmacy of record: Pharmacy where electronic prescriptions will be sent. If written prescriptions are taken to a different pharmacy, please inform the nursing staff. The pharmacy listed in the electronic medical record should be the  one where you would like electronic prescriptions to be sent.  Electronic prescriptions: In compliance with the [facility] Strengthen Opioid Misuse Prevention (STOP) Act of 2017 (Session Conni Elliot [phone]), effective June 13, 2018, all controlled substances must be electronically prescribed. Calling prescriptions to the pharmacy will cease to exist.  Prescription refills: Only during scheduled appointments. Applies to all  prescriptions.  NOTE: The following applies primarily to controlled substances (Opioid* Pain Medications).   Patient's responsibilities: 1. Pain Pills: Bring all pain pills to every appointment (except for procedure appointments). 2. Pill Bottles: Bring pills in original pharmacy bottle. Always bring the newest bottle. Bring bottle, even if empty. 3. Medication refills: You are responsible for knowing and keeping track of what medications you take and those you need refilled. The day before your appointment: write a list of all prescriptions that need to be refilled. The day of the appointment: give the list to the admitting nurse. Prescriptions will be written only during appointments. No prescriptions will be written on procedure days. If you forget a medication: it will not be "Called in", "Faxed", or "electronically sent". You will need to get another appointment to get these prescribed. No early refills. Do not call asking to have your prescription filled early. 4. Prescription Accuracy: You are responsible for carefully inspecting your prescriptions before leaving our office. Have the discharge nurse carefully go over each prescription with you, before taking them home. Make sure that your name is accurately spelled, that your address is correct. Check the name and dose of your medication to make sure it is accurate. Check the number of pills, and the written instructions to make sure they are clear and accurate. Make sure that you are given enough medication to last until your next medication refill appointment. 5. Taking Medication: Take medication as prescribed. When it comes to controlled substances, taking less pills or less frequently than prescribed is permitted and encouraged. Never take more pills than instructed. Never take medication more frequently than prescribed.  6. Inform other Doctors: Always inform, all of your healthcare providers, of all the medications you take. 7. Pain  Medication from other Providers: You are not allowed to accept any additional pain medication from any other Doctor or Healthcare provider. There are two exceptions to this rule. (see below) In the event that you require additional pain medication, you are responsible for notifying us, as stated below. 8. Medication Agreement: You are responsible for carefully reading and following our Medication Agreement. This must be signed before receiving any prescriptions from our practice. Safely store a copy of your signed Agreement. Violations to the Agreement will result in no further prescriptions. (Additional copies of our Medication Agreement are available upon request.) 9. Laws, Rules, & Regulations: All patients are expected to follow all [address] and [employer], Rules, [place]. Ignorance of the Laws does not constitute a valid excuse. The use of any illegal substances is prohibited. 10. Adopted CDC guidelines & recommendations: Target dosing levels will be at or below 60 MME/day. Use of benzodiazepines** is not recommended.  Exceptions: There are only two exceptions to the rule of not receiving pain medications from other Healthcare Providers. 1. Exception #1 (Emergencies): In the event of an emergency (i.e.: accident requiring emergency care), you are allowed to receive additional pain medication. However, you are responsible for: As soon as you are able, call our office [phone], at any time of the day or night, and leave a message stating your name, the date and  nature of the emergency, and the name and dose of the medication prescribed. In the event that your call is answered by a member of our staff, make sure to document and save the date, time, and the name of the person that took your information.  2. Exception #2 (Planned Surgery): In the event that you are scheduled by another doctor or dentist to have any type of surgery or procedure, you are allowed (for a period no longer than  30 days), to receive additional pain medication, for the acute post-op pain. However, in this case, you are responsible for picking up a copy of our "Post-op Pain Management for Surgeons" handout, and giving it to your surgeon or dentist. This document is available at our office, and does not require an appointment to obtain it. Simply go to our office during business hours (Monday-Thursday from 8:00 AM to 4:00 PM) (Friday 8:00 AM to 12:00 Noon) or if you have a scheduled appointment with us, prior to your surgery, and ask for it by name. In addition, you will need to provide us with your name, name of your surgeon, type of surgery, and date of procedure or surgery.  *Opioid medications include: morphine, codeine, oxycodone, oxymorphone, hydrocodone, hydromorphone, meperidine, tramadol, tapentadol, buprenorphine, fentanyl, methadone. **Benzodiazepine medications include: diazepam (Valium), alprazolam (Xanax), clonazepam (Klonopine), lorazepam (Ativan), clorazepate (Tranxene), chlordiazepoxide (Librium), estazolam (Prosom), oxazepam (Serax), temazepam (Restoril), triazolam (Halcion) (Last updated: 08/10/2017) ____________________________________________________________________________________________   ____________________________________________________________________________________________  Medication Recommendations and Reminders  Applies to: All patients receiving prescriptions (written and/or electronic).  Medication Rules & Regulations: These rules and regulations exist for your safety and that of others. They are not flexible and neither are we. Dismissing or ignoring them will be considered "non-compliance" with medication therapy, resulting in complete and irreversible termination of such therapy. (See document titled "Medication Rules" for more details.) In all conscience, because of safety reasons, we cannot continue providing a therapy where the patient does not follow  instructions.  Pharmacy of record:   Definition: This is the pharmacy where your electronic prescriptions will be sent.   We do not endorse any particular pharmacy.  You are not restricted in your choice of pharmacy.  The pharmacy listed in the electronic medical record should be the one where you want electronic prescriptions to be sent.  If you choose to change pharmacy, simply notify our nursing staff of your choice of new pharmacy.  Recommendations:  Keep all of your pain medications in a safe place, under lock and key, even if you live alone.   After you fill your prescription, take 1 week's worth of pills and put them away in a safe place. You should keep a separate, properly labeled bottle for this purpose. The remainder should be kept in the original bottle. Use this as your primary supply, until it runs out. Once it's gone, then you know that you have 1 week's worth of medicine, and it is time to come in for a prescription refill. If you do this correctly, it is unlikely that you will ever run out of medicine.  To make sure that the above recommendation works, it is very important that you make sure your medication refill appointments are scheduled at least 1 week before you run out of medicine. To do this in an effective manner, make sure that you do not leave the office without scheduling your next medication management appointment. Always ask the nursing staff to show you in your prescription , when your medication will be  running out. Then arrange for the receptionist to get you a return appointment, at least 7 days before you run out of medicine. Do not wait until you have 1 or 2 pills left, to come in. This is very poor planning and does not take into consideration that we may need to cancel appointments due to bad weather, sickness, or emergencies affecting our staff.  "Partial Fill": If for any reason your pharmacy does not have enough pills/tablets to completely fill or refill  your prescription, do not allow for a "partial fill". You will need a separate prescription to fill the remaining amount, which we will not provide. If the reason for the partial fill is your insurance, you will need to talk to the pharmacist about payment alternatives for the remaining tablets, but again, do not accept a partial fill.  Prescription refills and/or changes in medication(s):   Prescription refills, and/or changes in dose or medication, will be conducted only during scheduled medication management appointments. (Applies to both, written and electronic prescriptions.)  No refills on procedure days. No medication will be changed or started on procedure days. No changes, adjustments, and/or refills will be conducted on a procedure day. Doing so will interfere with the diagnostic portion of the procedure.  No phone refills. No medications will be "called into the pharmacy".  No Fax refills.  No weekend refills.  No Holliday refills.  No after hours refills.  Remember:  Business hours are:  Monday to Thursday 8:00 AM to 4:00 PM Provider's Schedule: Thad Ranger, NP - Appointments are:  Medication management: Monday to Thursday 8:00 AM to 4:00 PM Delano Metz, MD - Appointments are:  Medication management: Monday and Wednesday 8:00 AM to 4:00 PM Procedure day: Tuesday and Thursday 7:30 AM to 4:00 PM Edward Jolly, MD - Appointments are:  Medication management: Tuesday and Thursday 8:00 AM to 4:00 PM Procedure day: Monday and Wednesday 7:30 AM to 4:00 PM (Last update: 08/10/2017) ____________________________________________________________________________________________

## 2018-08-07 ENCOUNTER — Ambulatory Visit: Payer: Medicare Other | Admitting: Pain Medicine

## 2018-08-07 DIAGNOSIS — M5136 Other intervertebral disc degeneration, lumbar region: Secondary | ICD-10-CM | POA: Insufficient documentation

## 2018-08-07 DIAGNOSIS — M47817 Spondylosis without myelopathy or radiculopathy, lumbosacral region: Secondary | ICD-10-CM | POA: Insufficient documentation

## 2018-08-07 NOTE — Progress Notes (Deleted)
Patient's Name: Dawn Frank  MRN: [id number]  Referring Provider: [facility]  DOB: 1986/03/15  [facility]  DOS: 08/07/2018  Note by: Oswaldo Done, MD  Service setting: Ambulatory outpatient  Specialty: Interventional Pain Management  Patient type: Established  Location: [facility] (AMB) Pain Management Facility  Visit type: Interventional Procedure   Primary Reason for Visit: Interventional Pain Management Treatment. CC: No chief complaint on file.  Procedure:          Anesthesia, Analgesia, Anxiolysis:  Type: Lumbar Facet, Medial Branch Block(s) #1  Primary Purpose: Diagnostic Region: Posterolateral Lumbosacral Spine Level: L2, L3, L4, L5, & S1 Medial Branch Level(s). Injecting these levels blocks the L3-4, L4-5, and L5-S1 lumbar facet joints. Laterality: Bilateral  Type: Moderate (Conscious) Sedation combined with Local Anesthesia Indication(s): Analgesia and Anxiety Route: Intravenous (IV) IV Access: Secured Sedation: Meaningful verbal contact was maintained at all times during the procedure  Local Anesthetic: Lidocaine 1-2%  Position: Prone   Indications: 1. Spondylosis without myelopathy or radiculopathy, lumbosacral region   2. Lumbar facet syndrome (Bilateral)   3. Lumbar facet hypertrophy (Multilevel)   4. DDD (degenerative disc disease), lumbar   5. Chronic low back pain (Primary area of Pain)  (Bilateral) (R>L) w/o sciatica    Pain Score: Pre-procedure:  /10 Post-procedure:  /10  Pre-op Assessment:  Dawn Frank is a 33 y.o. (year old), female patient, seen today for interventional treatment. She  has no past surgical history on file. Dawn Frank has a current medication list which includes the following prescription(s): vitamin c, azithromycin, chlorpheniramine-hydrocodone, cyclobenzaprine, guaifenesin-codeine, hydrochlorothiazide, hydrocodone-acetaminophen, lisinopril, naproxen, nortriptyline, ondansetron, and  sertraline. Her primarily concern today is the No chief complaint on file.  Initial Vital Signs:  Pulse/HCG Rate:    Temp:   Resp:   BP:   SpO2:    BMI: Estimated body mass index is 33.65 kg/m as calculated from the following:   Height as of 07/30/18:  (1.575 m).   Weight as of 07/30/18: 184 lb (83.5 kg).  Risk Assessment: Allergies: Reviewed. She has No Known Allergies.  Allergy Precautions: None required Coagulopathies: Reviewed. None identified.  Blood-thinner therapy: None at this time Active Infection(s): Reviewed. None identified. Dawn Frank is afebrile  Site Confirmation: Dawn Frank was asked to confirm the procedure and laterality before marking the site Procedure checklist: Completed Consent: Before the procedure and under the influence of no sedative(s), amnesic(s), or anxiolytics, the patient was informed of the treatment options, risks and possible complications. To fulfill our ethical and legal obligations, as recommended by the American Medical Association's Code of Ethics, I have informed the patient of my clinical impression; the nature and purpose of the treatment or procedure; the risks, benefits, and possible complications of the intervention; the alternatives, including doing nothing; the risk(s) and benefit(s) of the alternative treatment(s) or procedure(s); and the risk(s) and benefit(s) of doing nothing. The patient was provided information about the general risks and possible complications associated with the procedure. These may include, but are not limited to: failure to achieve desired goals, infection, bleeding, organ or nerve damage, allergic reactions, paralysis, and death. In addition, the patient was informed of those risks and complications associated to Spine-related procedures, such as failure to decrease pain; infection (i.e.: Meningitis, epidural or intraspinal abscess); bleeding (i.e.: epidural hematoma, subarachnoid hemorrhage, or any other type of  intraspinal or peri-dural bleeding); organ or nerve damage (i.e.: Any type of peripheral nerve, nerve root, or spinal cord injury)  with subsequent damage to sensory, motor, and/or autonomic systems, resulting in permanent pain, numbness, and/or weakness of one or several areas of the body; allergic reactions; (i.e.: anaphylactic reaction); and/or death. Furthermore, the patient was informed of those risks and complications associated with the medications. These include, but are not limited to: allergic reactions (i.e.: anaphylactic or anaphylactoid reaction(s)); adrenal axis suppression; blood sugar elevation that in diabetics may result in ketoacidosis or comma; water retention that in patients with history of congestive heart failure may result in shortness of breath, pulmonary edema, and decompensation with resultant heart failure; weight gain; swelling or edema; medication-induced neural toxicity; particulate matter embolism and blood vessel occlusion with resultant organ, and/or nervous system infarction; and/or aseptic necrosis of one or more joints. Finally, the patient was informed that Medicine is not an exact science; therefore, there is also the possibility of unforeseen or unpredictable risks and/or possible complications that may result in a catastrophic outcome. The patient indicated having understood very clearly. We have given the patient no guarantees and we have made no promises. Enough time was given to the patient to ask questions, all of which were answered to the patient's satisfaction. Dawn Frank has indicated that she wanted to continue with the procedure. Attestation: I, the ordering provider, attest that I have discussed with the patient the benefits, risks, side-effects, alternatives, likelihood of achieving goals, and potential problems during recovery for the procedure that I have provided informed consent. Date  Time: {CHL [facility]-PAIN TIME CHOICES:[id number]}  Pre-Procedure  Preparation:  Monitoring: As per clinic protocol. Respiration, ETCO2, SpO2, BP, heart rate and rhythm monitor placed and checked for adequate function Safety Precautions: Patient was assessed for positional comfort and pressure points before starting the procedure. Time-out: I initiated and conducted the "Time-out" before starting the procedure, as per protocol. The patient was asked to participate by confirming the accuracy of the "Time Out" information. Verification of the correct person, site, and procedure were performed and confirmed by me, the nursing staff, and the patient. "Time-out" conducted as per Joint Commission's Universal Protocol (UP.01.01.01). Time:    Description of Procedure:          Laterality: Bilateral. The procedure was performed in identical fashion on both sides. Levels:  L2, L3, L4, L5, & S1 Medial Branch Level(s) Area Prepped: Posterior Lumbosacral Region Prepping solution: ChloraPrep (2% chlorhexidine gluconate and 70% isopropyl alcohol) Safety Precautions: Aspiration looking for blood return was conducted prior to all injections. At no point did we inject any substances, as a needle was being advanced. Before injecting, the patient was told to immediately notify me if she was experiencing any new onset of "ringing in the ears, or metallic taste in the mouth". No attempts were made at seeking any paresthesias. Safe injection practices and needle disposal techniques used. Medications properly checked for expiration dates. SDV (single dose vial) medications used. After the completion of the procedure, all disposable equipment used was discarded in the proper designated medical waste containers. Local Anesthesia: Protocol guidelines were followed. The patient was positioned over the fluoroscopy table. The area was prepped in the usual manner. The time-out was completed. The target area was identified using fluoroscopy. A 12-in long, straight, sterile hemostat was used with  fluoroscopic guidance to locate the targets for each level blocked. Once located, the skin was marked with an approved surgical skin marker. Once all sites were marked, the skin (epidermis, dermis, and hypodermis), as well as deeper tissues (fat, connective tissue and muscle) were infiltrated with a  small amount of a short-acting local anesthetic, loaded on a 10cc syringe with a 25G, 1.5-in  Needle. An appropriate amount of time was allowed for local anesthetics to take effect before proceeding to the next step. Local Anesthetic: Lidocaine 2.0% The unused portion of the local anesthetic was discarded in the proper designated containers. Technical explanation of process:  L2 Medial Branch Nerve Block (MBB): The target area for the L2 medial branch is at the junction of the postero-lateral aspect of the superior articular process and the superior, posterior, and medial edge of the transverse process of L3. Under fluoroscopic guidance, a Quincke needle was inserted until contact was made with os over the superior postero-lateral aspect of the pedicular shadow (target area). After negative aspiration for blood, 0.5 mL of the nerve block solution was injected without difficulty or complication. The needle was removed intact. L3 Medial Branch Nerve Block (MBB): The target area for the L3 medial branch is at the junction of the postero-lateral aspect of the superior articular process and the superior, posterior, and medial edge of the transverse process of L4. Under fluoroscopic guidance, a Quincke needle was inserted until contact was made with os over the superior postero-lateral aspect of the pedicular shadow (target area). After negative aspiration for blood, 0.5 mL of the nerve block solution was injected without difficulty or complication. The needle was removed intact. L4 Medial Branch Nerve Block (MBB): The target area for the L4 medial branch is at the junction of the postero-lateral aspect of the superior  articular process and the superior, posterior, and medial edge of the transverse process of L5. Under fluoroscopic guidance, a Quincke needle was inserted until contact was made with os over the superior postero-lateral aspect of the pedicular shadow (target area). After negative aspiration for blood, 0.5 mL of the nerve block solution was injected without difficulty or complication. The needle was removed intact. L5 Medial Branch Nerve Block (MBB): The target area for the L5 medial branch is at the junction of the postero-lateral aspect of the superior articular process and the superior, posterior, and medial edge of the sacral ala. Under fluoroscopic guidance, a Quincke needle was inserted until contact was made with os over the superior postero-lateral aspect of the pedicular shadow (target area). After negative aspiration for blood, 0.5 mL of the nerve block solution was injected without difficulty or complication. The needle was removed intact. S1 Medial Branch Nerve Block (MBB): The target area for the S1 medial branch is at the posterior and inferior 6 o'clock position of the L5-S1 facet joint. Under fluoroscopic guidance, the Quincke needle inserted for the L5 MBB was redirected until contact was made with os over the inferior and postero aspect of the sacrum, at the 6 o' clock position under the L5-S1 facet joint (Target area). After negative aspiration for blood, 0.5 mL of the nerve block solution was injected without difficulty or complication. The needle was removed intact.  Nerve block solution: 0.2% PF-Ropivacaine + Triamcinolone (40 mg/mL) diluted to a final concentration of 4 mg of Triamcinolone/mL of Ropivacaine The unused portion of the solution was discarded in the proper designated containers. Procedural Needles: 22-gauge, 3.5-inch, Quincke needles used for all levels.  Once the entire procedure was completed, the treated area was cleaned, making sure to leave some of the prepping solution  back to take advantage of its long term bactericidal properties.   Illustration of the posterior view of the lumbar spine and the posterior neural structures. Laminae of L2 through  S1 are labeled. DPRL5, dorsal primary ramus of L5; DPRS1, dorsal primary ramus of S1; DPR3, dorsal primary ramus of L3; FJ, facet (zygapophyseal) joint L3-L4; I, inferior articular process of L4; LB1, lateral branch of dorsal primary ramus of L1; IAB, inferior articular branches from L3 medial branch (supplies L4-L5 facet joint); IBP, intermediate branch plexus; MB3, medial branch of dorsal primary ramus of L3; NR3, third lumbar nerve root; S, superior articular process of L5; SAB, superior articular branches from L4 (supplies L4-5 facet joint also); TP3, transverse process of L3.  There were no vitals filed for this visit.   Start Time:   hrs. End Time:   hrs.  Imaging Guidance (Spinal):          Type of Imaging Technique: Fluoroscopy Guidance (Spinal) Indication(s): Assistance in needle guidance and placement for procedures requiring needle placement in or near specific anatomical locations not easily accessible without such assistance. Exposure Time: Please see nurses notes. Contrast: None used. Fluoroscopic Guidance: I was personally present during the use of fluoroscopy. "Tunnel Vision Technique" used to obtain the best possible view of the target area. Parallax error corrected before commencing the procedure. "Direction-depth-direction" technique used to introduce the needle under continuous pulsed fluoroscopy. Once target was reached, antero-posterior, oblique, and lateral fluoroscopic projection used confirm needle placement in all planes. Images permanently stored in EMR. Interpretation: No contrast injected. I personally interpreted the imaging intraoperatively. Adequate needle placement confirmed in multiple planes. Permanent images saved into the patient's record.  Antibiotic Prophylaxis:   Anti-infectives  (From admission, onward)   None     Indication(s): None identified  Post-operative Assessment:  Post-procedure Vital Signs:  Pulse/HCG Rate:    Temp:   Resp:   BP:   SpO2:    EBL: None  Complications: No immediate post-treatment complications observed by team, or reported by patient.  Note: The patient tolerated the entire procedure well. A repeat set of vitals were taken after the procedure and the patient was kept under observation following institutional policy, for this type of procedure. Post-procedural neurological assessment was performed, showing return to baseline, prior to discharge. The patient was provided with post-procedure discharge instructions, including a section on how to identify potential problems. Should any problems arise concerning this procedure, the patient was given instructions to immediately contact us, at any time, without hesitation. In any case, we plan to contact the patient by telephone for a follow-up status report regarding this interventional procedure.  Comments:  No additional relevant information.  Plan of Care   Imaging Orders  No imaging studies ordered today   Procedure Orders    No procedure(s) ordered today    Medications ordered for procedure: No orders of the defined types were placed in this encounter.  Medications administered: Dawn Frank had no medications administered during this visit.  See the medical record for exact dosing, route, and time of administration.  Disposition: Discharge home  Discharge Date & Time: 08/07/2018;   hrs.   Physician-requested Follow-up: No follow-ups on file.  Future Appointments  Date Time Provider Department Center  08/07/2018 12:30 PM Delano Metz, MD [facility] None  08/08/2018  1:45 PM Staci Acosta, PT [facility] None  08/13/2018  1:45 PM Staci Acosta, PT [facility] None  08/15/2018  1:00 PM Staci Acosta, PT [facility] None  08/21/2018  1:45 PM Staci Acosta, PT [facility]  None  08/22/2018  1:00 PM Staci Acosta, PT [facility] None  08/27/2018  1:45 PM Staci Acosta, PT [facility] None  08/29/2018  1:00 PM Staci Acosta, PT [facility] None  09/03/2018  1:45 PM Staci Acosta, PT [facility] None  09/05/2018  1:00 PM Staci Acosta, PT [facility] None  09/10/2018  1:00 PM Staci Acosta, PT [facility] None  09/12/2018  1:00 PM Staci Acosta, PT [facility] None   Primary Care Physician: [facility] Location: [facility] Note by: Oswaldo Done, MD Date: 08/07/2018; Time: 6:53 AM  Disclaimer:  Medicine is not an Visual merchandiser. The only guarantee in medicine is that nothing is guaranteed. It is important to note that the decision to proceed with this intervention was based on the information collected from the patient. The Data and conclusions were drawn from the patient's questionnaire, the interview, and the physical examination. Because the information was provided in large part by the patient, it cannot be guaranteed that it has not been purposely or unconsciously manipulated. Every effort has been made to obtain as much relevant data as possible for this evaluation. It is important to note that the conclusions that lead to this procedure are derived in large part from the available data. Always take into account that the treatment will also be dependent on availability of resources and existing treatment guidelines, considered by other Pain Management Practitioners as being common knowledge and practice, at the time of the intervention. For Medico-Legal purposes, it is also important to point out that variation in procedural techniques and pharmacological choices are the acceptable norm. The indications, contraindications, technique, and results of the above procedure should only be interpreted and judged by a Board-Certified Interventional Pain Specialist with extensive familiarity and expertise in the same exact  procedure and technique.

## 2018-08-08 ENCOUNTER — Ambulatory Visit: Payer: Medicare Other | Attending: Nurse Practitioner | Admitting: Physical Therapy

## 2018-08-08 ENCOUNTER — Ambulatory Visit
Admission: RE | Admit: 2018-08-08 | Discharge: 2018-08-08 | Disposition: A | Discharge status: Home / Self Care | Payer: Medicare Other | Attending: Family Medicine | Admitting: Family Medicine

## 2018-08-08 ENCOUNTER — Other Ambulatory Visit: Payer: Self-pay | Admitting: Family Medicine

## 2018-08-08 ENCOUNTER — Ambulatory Visit
Admission: RE | Admit: 2018-08-08 | Discharge: 2018-08-08 | Disposition: A | Discharge status: Home / Self Care | Payer: Medicare Other | Source: Ambulatory Visit | Attending: Family Medicine | Admitting: Family Medicine

## 2018-08-08 DIAGNOSIS — R0602 Shortness of breath: Secondary | ICD-10-CM | POA: Insufficient documentation

## 2018-08-08 DIAGNOSIS — R05 Cough: Secondary | ICD-10-CM | POA: Insufficient documentation

## 2018-08-08 DIAGNOSIS — R059 Cough, unspecified: Secondary | ICD-10-CM

## 2018-08-13 ENCOUNTER — Ambulatory Visit: Payer: Medicare Other | Admitting: Physical Therapy

## 2018-08-15 ENCOUNTER — Ambulatory Visit: Payer: Medicare Other | Admitting: Physical Therapy

## 2018-08-21 ENCOUNTER — Encounter: Payer: Medicare Other | Admitting: Physical Therapy

## 2018-08-22 ENCOUNTER — Encounter: Payer: Medicare Other | Admitting: Physical Therapy

## 2018-08-27 ENCOUNTER — Encounter: Payer: Medicare Other | Admitting: Physical Therapy

## 2018-08-29 ENCOUNTER — Encounter: Payer: Medicare Other | Admitting: Physical Therapy

## 2018-09-03 ENCOUNTER — Encounter: Payer: Medicare Other | Admitting: Physical Therapy

## 2018-09-05 ENCOUNTER — Encounter: Payer: Medicare Other | Admitting: Physical Therapy

## 2018-09-10 ENCOUNTER — Encounter: Payer: Medicare Other | Admitting: Physical Therapy

## 2018-09-12 ENCOUNTER — Encounter: Payer: Medicare Other | Admitting: Physical Therapy

## 2019-05-28 ENCOUNTER — Other Ambulatory Visit: Payer: Self-pay | Admitting: Neurology

## 2019-05-28 DIAGNOSIS — R200 Anesthesia of skin: Secondary | ICD-10-CM

## 2019-05-28 DIAGNOSIS — H539 Unspecified visual disturbance: Secondary | ICD-10-CM

## 2019-06-08 ENCOUNTER — Ambulatory Visit
Admission: RE | Admit: 2019-06-08 | Discharge: 2019-06-08 | Disposition: A | Discharge status: Home / Self Care | Payer: Medicare HMO | Source: Ambulatory Visit | Attending: Neurology | Admitting: Neurology

## 2019-06-08 ENCOUNTER — Other Ambulatory Visit: Payer: Self-pay

## 2019-06-08 DIAGNOSIS — H539 Unspecified visual disturbance: Secondary | ICD-10-CM | POA: Insufficient documentation

## 2019-06-08 DIAGNOSIS — R202 Paresthesia of skin: Secondary | ICD-10-CM | POA: Diagnosis present

## 2019-06-08 DIAGNOSIS — R200 Anesthesia of skin: Secondary | ICD-10-CM | POA: Diagnosis present

## 2019-07-27 IMAGING — CR DG CHEST 2V
1 series · 2 of 2 positions shown · non-contrast
Comparison: 05/12/2016

CLINICAL DATA: Bronchitis.

EXAM:
CHEST - 2 VIEW

[Series 1: w chest pa · 0.14mm/px · 2 of 2 slices shown]
[im 1/2]
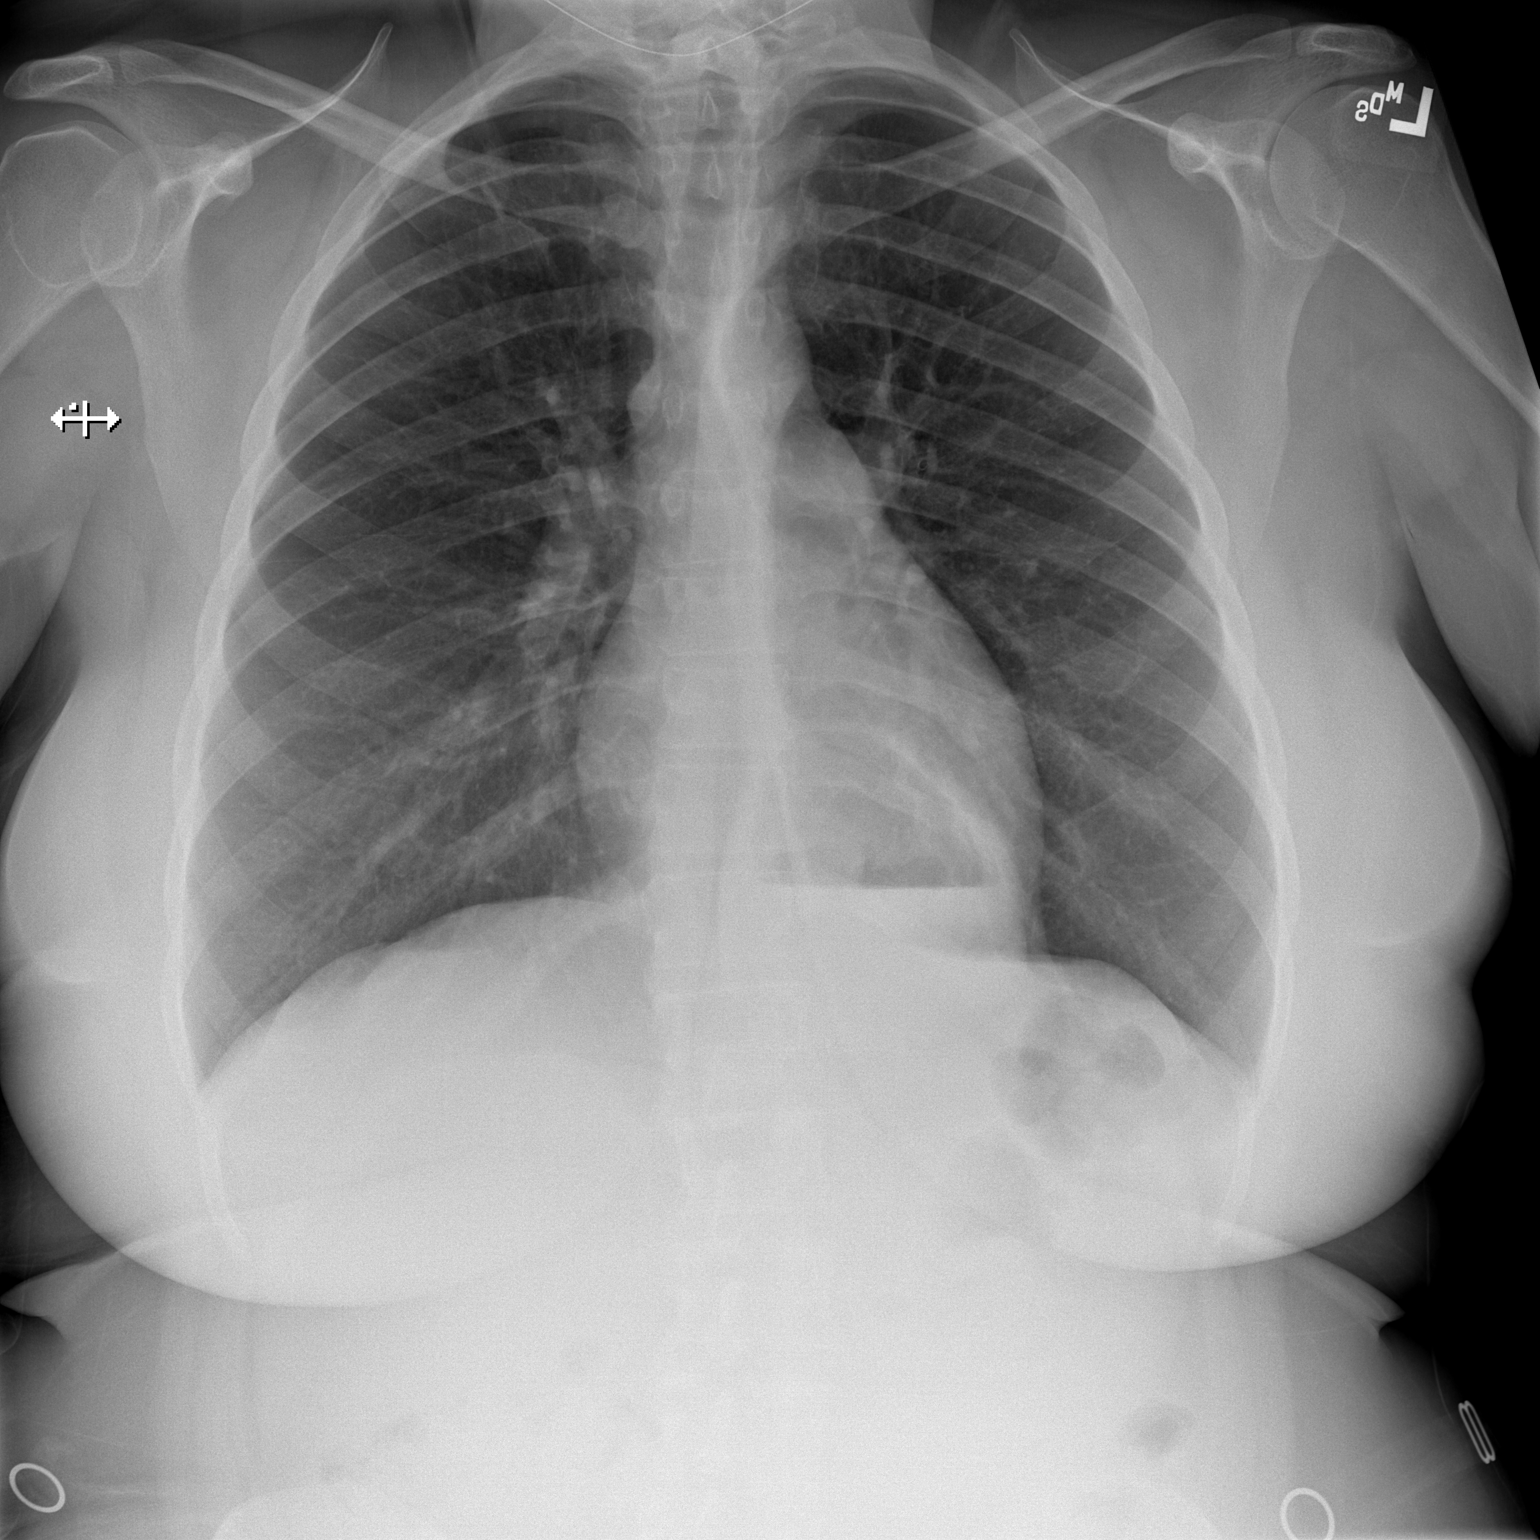
[im 2/2]
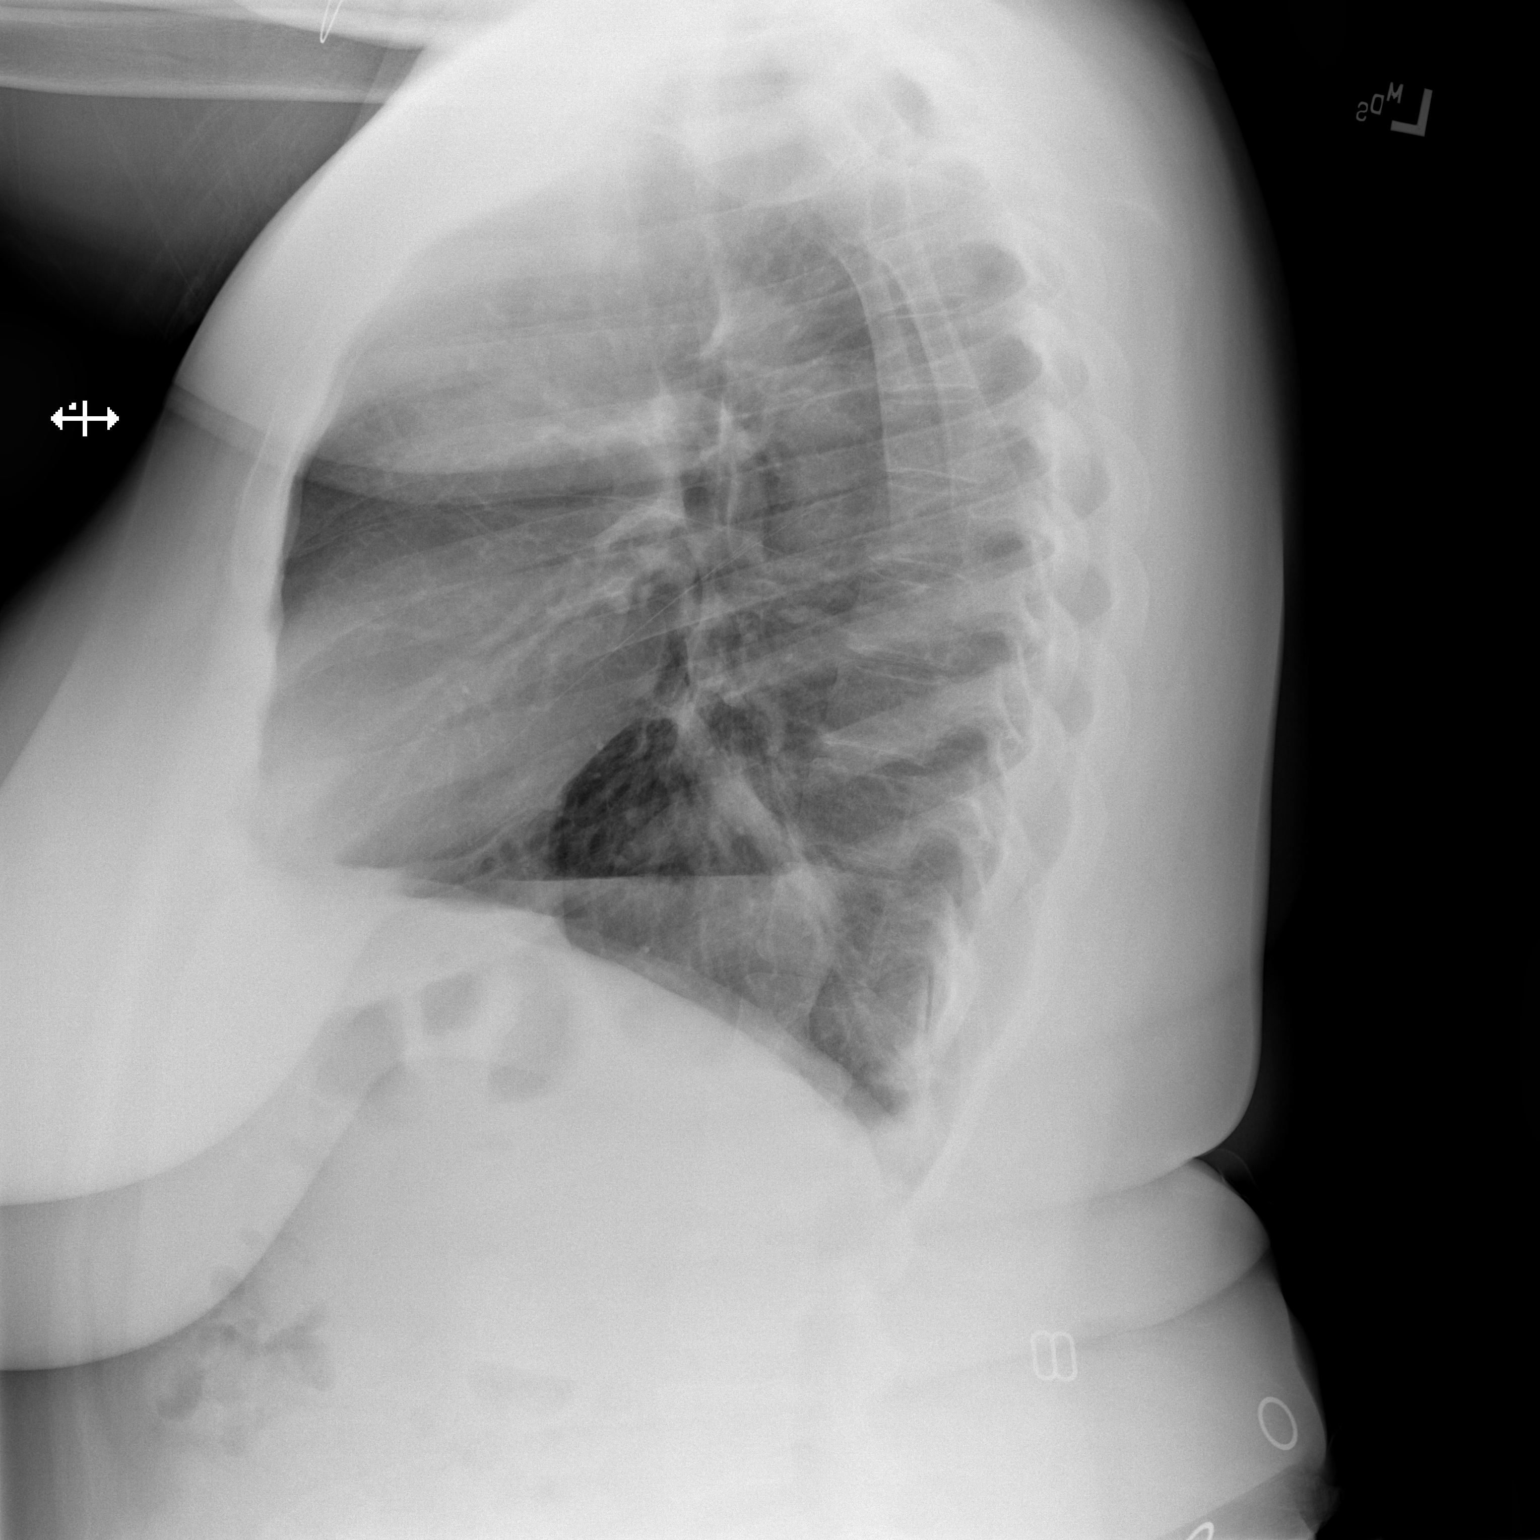

[2 of 2 positions shown; findings below may reference images not displayed]

FINDINGS: Cardiomediastinal silhouette is normal. Mediastinal contours appear
intact. Moderate in size hiatal hernia.

There is no evidence of focal airspace consolidation, pleural
effusion or pneumothorax.

Osseous structures are without acute abnormality. Soft tissues are
grossly normal.
IMPRESSION: 1. No active cardiopulmonary disease.
2. Moderate in size hiatal hernia.

## 2019-09-14 ENCOUNTER — Ambulatory Visit: Payer: Medicare HMO | Attending: Internal Medicine

## 2019-09-14 DIAGNOSIS — Z23 Encounter for immunization: Secondary | ICD-10-CM

## 2019-09-14 NOTE — Progress Notes (Signed)
   Covid-19 Vaccination Clinic  Name:  Dawn Frank    MRN: [id number] DOB: 05/15/1986  09/14/2019  Dawn Frank was observed post Covid-19 immunization for 15 minutes without incident. She was provided with Vaccine Information Sheet and instruction to access the V-Safe system.   Dawn Frank was instructed to call 911 with any severe reactions post vaccine: Marland Kitchen Difficulty breathing  . Swelling of face and throat  . A fast heartbeat  . A bad rash all over body  . Dizziness and weakness   Immunizations Administered    Name Date Dose VIS Date Route   Pfizer COVID-19 Vaccine 09/14/2019  9:24 AM 0.3 mL 05/24/2019 Intramuscular   Manufacturer: [employer], [employer]   Lot: [id number]   NDC: [address]-8

## 2019-10-05 ENCOUNTER — Ambulatory Visit: Payer: Medicare HMO | Attending: Internal Medicine

## 2019-10-05 DIAGNOSIS — Z23 Encounter for immunization: Secondary | ICD-10-CM

## 2019-10-05 NOTE — Progress Notes (Signed)
   Covid-19 Vaccination Clinic  Name:  LYLA JASEK    MRN: [id number] DOB: 12/31/1985  10/05/2019  Ms. Dickie was observed post Covid-19 immunization for 15 minutes without incident. She was provided with Vaccine Information Sheet and instruction to access the V-Safe system.   Ms. Zanders was instructed to call 911 with any severe reactions post vaccine: Marland Kitchen Difficulty breathing  . Swelling of face and throat  . A fast heartbeat  . A bad rash all over body  . Dizziness and weakness   Immunizations Administered    Name Date Dose VIS Date Route   Pfizer COVID-19 Vaccine 10/05/2019  9:39 AM 0.3 mL 08/07/2018 Intramuscular   Manufacturer: [employer], [employer]   Lot: [id number]   NDC: [address]-4

## 2019-10-11 ENCOUNTER — Other Ambulatory Visit: Payer: Self-pay | Admitting: Neurology

## 2019-10-11 DIAGNOSIS — G8929 Other chronic pain: Secondary | ICD-10-CM

## 2019-10-22 ENCOUNTER — Ambulatory Visit
Admission: RE | Admit: 2019-10-22 | Discharge: 2019-10-22 | Disposition: A | Discharge status: Home / Self Care | Payer: Medicare HMO | Source: Ambulatory Visit | Attending: Neurology | Admitting: Neurology

## 2019-10-22 ENCOUNTER — Other Ambulatory Visit: Payer: Self-pay

## 2019-10-22 DIAGNOSIS — M5442 Lumbago with sciatica, left side: Secondary | ICD-10-CM | POA: Diagnosis present

## 2019-10-22 DIAGNOSIS — G8929 Other chronic pain: Secondary | ICD-10-CM | POA: Diagnosis present

## 2019-10-22 DIAGNOSIS — M5441 Lumbago with sciatica, right side: Secondary | ICD-10-CM | POA: Diagnosis present

## 2020-03-02 ENCOUNTER — Other Ambulatory Visit: Payer: Self-pay

## 2020-03-02 ENCOUNTER — Emergency Department
Admission: EM | Admit: 2020-03-02 | Discharge: 2020-03-02 | Disposition: A | Discharge status: Home / Self Care | Payer: Medicare HMO | Attending: Emergency Medicine | Admitting: Emergency Medicine

## 2020-03-02 ENCOUNTER — Emergency Department: Payer: Medicare HMO

## 2020-03-02 DIAGNOSIS — F17210 Nicotine dependence, cigarettes, uncomplicated: Secondary | ICD-10-CM | POA: Insufficient documentation

## 2020-03-02 DIAGNOSIS — Z79899 Other long term (current) drug therapy: Secondary | ICD-10-CM | POA: Diagnosis not present

## 2020-03-02 DIAGNOSIS — I10 Essential (primary) hypertension: Secondary | ICD-10-CM | POA: Insufficient documentation

## 2020-03-02 DIAGNOSIS — Z20822 Contact with and (suspected) exposure to covid-19: Secondary | ICD-10-CM | POA: Insufficient documentation

## 2020-03-02 DIAGNOSIS — R531 Weakness: Secondary | ICD-10-CM | POA: Insufficient documentation

## 2020-03-02 DIAGNOSIS — R55 Syncope and collapse: Secondary | ICD-10-CM

## 2020-03-02 LAB — URINALYSIS, COMPLETE (UACMP) WITH MICROSCOPIC
Bilirubin Urine: NEGATIVE
Glucose, UA: NEGATIVE mg/dL
Hgb urine dipstick: NEGATIVE
Ketones, ur: 5 mg/dL — AB
Leukocytes,Ua: NEGATIVE
Nitrite: NEGATIVE
Protein, ur: 30 mg/dL — AB
Specific Gravity, Urine: 1.031 — ABNORMAL HIGH (ref 1.005–1.030)
pH: 5 (ref 5.0–8.0)

## 2020-03-02 LAB — BASIC METABOLIC PANEL
Anion gap: 14 (ref 5–15)
BUN: 6 mg/dL (ref 6–20)
CO2: 24 mmol/L (ref 22–32)
Calcium: 8.8 mg/dL — ABNORMAL LOW (ref 8.9–10.3)
Chloride: 101 mmol/L (ref 98–111)
Creatinine, Ser: 0.72 mg/dL (ref 0.44–1.00)
GFR calc Af Amer: >60 mL/min (ref >60)
GFR calc non Af Amer: >60 mL/min (ref >60)
Glucose, Bld: 87 mg/dL (ref 70–99)
Potassium: 3.1 mmol/L — ABNORMAL LOW (ref 3.5–5.1)
Sodium: 139 mmol/L (ref 135–145)

## 2020-03-02 LAB — CBC
HCT: 42 % (ref 36.0–46.0)
Hemoglobin: 14.2 g/dL (ref 12.0–15.0)
MCH: 31.8 pg (ref 26.0–34.0)
MCHC: 33.8 g/dL (ref 30.0–36.0)
MCV: 94 fL (ref 80.0–100.0)
Platelets: 239 10*3/uL (ref 150–400)
RBC: 4.47 MIL/uL (ref 3.87–5.11)
RDW: 13.8 % (ref 11.5–15.5)
WBC: 8.7 10*3/uL (ref 4.0–10.5)
nRBC: 0 % (ref 0.0–0.2)

## 2020-03-02 LAB — SARS CORONAVIRUS 2 BY RT PCR (HOSPITAL ORDER, PERFORMED IN ~~LOC~~ HOSPITAL LAB): SARS Coronavirus 2: NEGATIVE

## 2020-03-02 MED ORDER — IBUPROFEN 400 MG PO TABS
400.0000 mg | ORAL_TABLET | Freq: Once | ORAL | Status: AC | PRN
  Administered 2020-03-02: 400 mg via ORAL
  Filled 2020-03-02: qty 1
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
## 2020-03-02 NOTE — ED Triage Notes (Addendum)
PT to ED via EMS with episode of syncope going into her house. PT states she felt "weird" prior too and her  Head was hurting really bad. PT states her legs and hands are numb and she feels weak. PT able to answer questions appropriately.

## 2020-03-02 NOTE — ED Provider Notes (Signed)
[facility] Emergency Department Provider Note  Time seen: 7:52 PM  I have reviewed the triage vital signs and the nursing notes.   HISTORY  Chief Complaint Loss of Consciousness   HPI Dawn Frank is a 34 y.o. female   with a past medical history of chronic back pain, hypertension, presents to the emergency department for generalized fatigue weakness and a near syncopal event.  According to the patient she had to work this morning, states she has been very over worked recently and has not had any time for herself.  Patient states she was working this morning and then cleaning her house when she began feeling very weak.  Patient states she had a syncope versus near syncopal episode.  Patient came to the emergency department for evaluation.  Does not believe she hit her head.  Patient states 4 days of generalized fatigue weakness but denies any cough nausea vomiting diarrhea or fever.  Patient has been vaccinated against Covid.  Past Medical History:  Diagnosis Date  . Cerebral palsy (HCC)   . Chronic back pain   . Hypertension     Patient Active Problem List   Diagnosis Date Noted  . Spondylosis without myelopathy or radiculopathy, lumbosacral region 08/07/2018  . DDD (degenerative disc disease), lumbar 08/07/2018  . Vitamin D deficiency 07/30/2018  . Lumbar facet hypertrophy (Multilevel) 07/30/2018  . Levoscoliosis 07/30/2018  . Chronic hip pain (Bilateral) (L>R) 07/30/2018  . Chronic sacroiliac joint pain (Bilateral) (R>L) 07/30/2018  . Lumbar facet syndrome (Bilateral) 07/30/2018  . Chronic musculoskeletal pain 07/30/2018  . Neurogenic pain 07/30/2018  . Genu valgus, congenital 07/30/2018  . Marijuana use 07/16/2018  . Chronic pain syndrome 07/10/2018  . Pharmacologic therapy 07/10/2018  . Disorder of skeletal system 07/10/2018  . Problems influencing health status 07/10/2018  . Chronic low back pain (Primary area of Pain) (Bilateral) (R>L) w/  sciatica (Bilateral) 07/10/2018  . Chronic lower extremity pain (Secondary Area of Pain) (Bilateral) (R>L) 07/10/2018  . Bilateral hand pain 02/09/2018  . Numbness and tingling 02/09/2018  . Carpal tunnel syndrome (CTS) (Right) 07/29/2016  . Chronic low back pain (Primary area of Pain)  (Bilateral) (R>L) w/o sciatica 07/28/2016  . Cerebral palsy, diplegic (HCC) 08/20/2015  . Cerebral palsy (HCC)     History reviewed. No pertinent surgical history.  Prior to Admission medications   Medication Sig Start Date End Date Taking? Authorizing Provider  Ascorbic Acid (VITAMIN C) 1000 MG tablet Take 1,000 mg by mouth daily.    [provider]  azithromycin (ZITHROMAX Z-PAK) 250 MG tablet Take 2 tablets (500 mg) on  Day 1,  followed by 1 tablet (250 mg) once daily on Days 2 through 5. Patient not taking: Reported on 07/10/2018 05/24/15   Evangeline Dakin, PA-C  chlorpheniramine-HYDROcodone [facility] PENNKINETIC ER) 10-8 MG/5ML SUER Take 5 mLs by mouth 2 (two) times daily. Patient not taking: Reported on 07/10/2018 05/12/16   Emily Filbert, MD  cyclobenzaprine (FLEXERIL) 10 MG tablet Take 10 mg by mouth at bedtime.    [provider]  guaiFENesin-codeine 100-10 MG/5ML syrup Take 10 mLs by mouth every 4 (four) hours as needed for cough. Patient not taking: Reported on 07/10/2018 08/15/15   Beers, Charmayne Sheer, PA-C  hydrochlorothiazide (HYDRODIURIL) 12.5 MG tablet Take 12.5 mg by mouth daily.    [provider]  HYDROcodone-acetaminophen (NORCO) 5-325 MG tablet Take 1-2 tablets by mouth every 4 (four) hours as needed for moderate pain. Patient not taking: Reported  on 07/10/2018 08/15/15   Evangeline Dakin, PA-C  lisinopril (PRINIVIL,ZESTRIL) 10 MG tablet Take 10 mg by mouth daily.    [provider]  naproxen (NAPROSYN) 500 MG tablet Take 1 tablet (500 mg total) by mouth 2 (two) times daily with a meal. 08/15/15   Beers, Charmayne Sheer, PA-C  nortriptyline (PAMELOR) 10 MG  capsule Take 10 mg by mouth at bedtime.    [provider]  ondansetron (ZOFRAN) 4 MG tablet Take 1 tablet (4 mg total) by mouth daily as needed for nausea or vomiting. Patient not taking: Reported on 07/10/2018 05/12/16   Emily Filbert, MD  sertraline (ZOLOFT) 50 MG tablet Take 50 mg by mouth daily.    [provider]    No Known Allergies  History reviewed. No pertinent family history.  Social History Social History   Tobacco Use  . Smoking status: Current Some Day Smoker    Packs/day: 0.50    Years: 6.00    Pack years: 3.00    Types: Cigarettes  . Smokeless tobacco: Never Used  Substance Use Topics  . Alcohol use: No  . Drug use: No    Review of Systems Constitutional: Negative for fever.  Positive for generalized fatigue and weakness Cardiovascular: Negative for chest pain. Respiratory: Negative for shortness of breath. Gastrointestinal: Negative for abdominal pain, vomiting and diarrhea. Genitourinary: Negative for urinary compaints Musculoskeletal: Negative for musculoskeletal complaints Skin: Negative for skin complaints  Neurological: Negative for headache All other ROS negative  ____________________________________________   PHYSICAL EXAM:  VITAL SIGNS: ED Triage Vitals  Enc Vitals Group     BP 03/02/20 1629 (!) 94/57     Pulse Rate 03/02/20 1629 64     Resp 03/02/20 1629 16     Temp 03/02/20 1636 (!) 97.5 F (36.4 C)     Temp Source 03/02/20 1636 Oral     SpO2 03/02/20 1629 99 %     Weight 03/02/20 1629 213 lb (96.6 kg)     Height 03/02/20 1629 5\' 2"  (1.575 m)     Head Circumference --      Peak Flow --      Pain Score 03/02/20 1629 10     Pain Loc --      Pain Edu? --      Excl. in GC? --     Constitutional: Alert and oriented. Well appearing and in no distress.  Currently eating [employer]. Eyes: Normal exam ENT      Head: Normocephalic and atraumatic.      Mouth/Throat: Mucous membranes are moist. Cardiovascular:  Normal rate, regular rhythm. No murmur Respiratory: Normal respiratory effort without tachypnea nor retractions. Breath sounds are clear  Gastrointestinal: Soft and nontender. No distention.   Musculoskeletal: Nontender with normal range of motion in all extremities.  Neurologic:  Normal speech and language. No gross focal neurologic deficits  Skin:  Skin is warm, dry and intact.  Psychiatric: Mood and affect are normal.   ____________________________________________    EKG  EKG viewed and interpreted by myself shows sinus bradycardia 58 bpm with a narrow QRS, normal axis, normal intervals, no concerning ST changes.  ____________________________________________    RADIOLOGY  CT scan of the head is negative  ____________________________________________   INITIAL IMPRESSION / ASSESSMENT AND PLAN / ED COURSE  Pertinent labs & imaging results that were available during my care of the patient were reviewed by me and considered in my medical decision making (see chart for details).   Patient  presents to the emergency department for generalized fatigue weakness ongoing for the past 3 to 4 days along with a syncope versus near syncopal episode today.  Overall the patient appears well, reassuring physical exam, reassuring vitals, reassuring EKG.  Given the patient's complaint of syncope/near syncope I did recommend IV hydration and awaiting a urine sample.  Patient states she has been here for hours and she is ready to go home.  Denies any urinary symptoms and wishes to leave.  She was agreeable to a Covid swab prior to discharge.  Discussed return precautions.  Dawn Frank was evaluated in Emergency Department on 03/02/2020 for the symptoms described in the history of present illness. She was evaluated in the context of the global COVID-19 pandemic, which necessitated consideration that the patient might be at risk for infection with the SARS-CoV-2 virus that causes COVID-19.  Institutional protocols and algorithms that pertain to the evaluation of patients at risk for COVID-19 are in a state of rapid change based on information released by regulatory bodies including the CDC and federal and state organizations. These policies and algorithms were followed during the patient's care in the ED.  ____________________________________________   FINAL CLINICAL IMPRESSION(S) / ED DIAGNOSES  Weakness Syncope   Minna Antis, MD 03/02/20 2009

## 2020-03-04 ENCOUNTER — Telehealth: Payer: Self-pay | Admitting: Emergency Medicine

## 2020-03-04 NOTE — Telephone Encounter (Signed)
Called patient due to lwot to inquire about condition and follow up plans. She had left message wanting her covid and urine result.  I explained that her tests could be seen by pcp.  I gave negative covid result and told her to ask her doctor about urine specimen.

## 2021-10-27 ENCOUNTER — Other Ambulatory Visit: Payer: Self-pay | Admitting: Sports Medicine

## 2021-10-27 DIAGNOSIS — G8929 Other chronic pain: Secondary | ICD-10-CM

## 2021-10-27 DIAGNOSIS — M25462 Effusion, left knee: Secondary | ICD-10-CM

## 2021-11-04 ENCOUNTER — Ambulatory Visit (INDEPENDENT_AMBULATORY_CARE_PROVIDER_SITE_OTHER): Payer: Medicare Other | Admitting: Nurse Practitioner

## 2021-11-04 ENCOUNTER — Encounter (INDEPENDENT_AMBULATORY_CARE_PROVIDER_SITE_OTHER): Payer: Self-pay | Admitting: Nurse Practitioner

## 2021-11-04 VITALS — BP 130/87 | HR 86 | Resp 17 | Ht 62.0 in | Wt 187.0 lb

## 2021-11-04 DIAGNOSIS — M792 Neuralgia and neuritis, unspecified: Secondary | ICD-10-CM

## 2021-11-04 DIAGNOSIS — M7989 Other specified soft tissue disorders: Secondary | ICD-10-CM | POA: Diagnosis not present

## 2021-11-04 DIAGNOSIS — M47817 Spondylosis without myelopathy or radiculopathy, lumbosacral region: Secondary | ICD-10-CM | POA: Diagnosis not present

## 2021-11-06 ENCOUNTER — Ambulatory Visit
Admission: RE | Admit: 2021-11-06 | Discharge: 2021-11-06 | Disposition: A | Discharge status: Home / Self Care | Payer: Medicare Other | Source: Ambulatory Visit | Attending: Sports Medicine | Admitting: Sports Medicine

## 2021-11-06 DIAGNOSIS — G8929 Other chronic pain: Secondary | ICD-10-CM | POA: Diagnosis present

## 2021-11-06 DIAGNOSIS — M25462 Effusion, left knee: Secondary | ICD-10-CM | POA: Diagnosis present

## 2021-11-06 DIAGNOSIS — M25562 Pain in left knee: Secondary | ICD-10-CM | POA: Diagnosis present

## 2021-11-13 ENCOUNTER — Encounter (INDEPENDENT_AMBULATORY_CARE_PROVIDER_SITE_OTHER): Payer: Self-pay | Admitting: Nurse Practitioner

## 2021-11-13 NOTE — Progress Notes (Signed)
Subjective:    Patient ID: Dawn Frank, female    DOB: Jan 16, 1986, 36 y.o.   MRN: [id number] Chief Complaint  Patient presents with   Establish Care    Referred by Dr Corwin Levins is a 36 year old female who presents today as a referral from her neurologist Dr. Melrose Nakayama in regards to lower extremity edema.  Patient has a history of multiple musculoskeletal issues including lower back issues.  She also has noted cerebral palsy.  The patient notes that her legs hurt.  She has a throbbing pain that is constant.  She notes that it is worse when she walks.  She also has swelling of her feet and ankles.  The swelling does decrease if she elevates her legs.  The swelling has been ongoing for approximately a year or so.  She has tried compression but it has not been helpful for her.  The patient has risk factors of possible peripheral arterial disease including smoking.  She denies any rest pain or open wounds or ulcerations.   Review of Systems  Cardiovascular:  Positive for leg swelling.  Musculoskeletal:  Positive for arthralgias, back pain and gait problem.  All other systems reviewed and are negative.     Objective:   Physical Exam Vitals reviewed.  HENT:     Head: Normocephalic.  Cardiovascular:     Rate and Rhythm: Normal rate.     Pulses:          Dorsalis pedis pulses are 0 on the right side and 0 on the left side.       Posterior tibial pulses are 0 on the right side and 0 on the left side.  Pulmonary:     Effort: Pulmonary effort is normal.  Musculoskeletal:     Right lower leg: 2+ Edema present.     Left lower leg: 2+ Edema present.  Skin:    General: Skin is warm and dry.  Neurological:     Mental Status: She is alert and oriented to person, place, and time.  Psychiatric:        Mood and Affect: Mood normal.        Behavior: Behavior normal.        Thought Content: Thought content normal.        Judgment: Judgment normal.     BP 130/87 (BP Location: Right Arm)   Pulse 86   Resp 17   Ht 5\' 2"  (1.575 m)   Wt 187 lb (84.8 kg)   BMI 34.20 kg/m   Past Medical History:  Diagnosis Date   Cerebral palsy (HCC)    Chronic back pain    Hypertension     Social History   Socioeconomic History   Marital status: Single    Spouse name: Not on file   Number of children: Not on file   Years of education: Not on file   Highest education level: Not on file  Occupational History   Not on file  Tobacco Use   Smoking status: Some Days    Packs/day: 0.50    Years: 6.00    Pack years: 3.00    Types: Cigarettes   Smokeless tobacco: Never  Substance and Sexual Activity   Alcohol use: No   Drug use: No   Sexual activity: Yes  Other Topics Concern   Not on file  Social History Narrative  Not on file   Social Determinants of Health   Financial Resource Strain: Not on file  Food Insecurity: Not on file  Transportation Needs: Not on file  Physical Activity: Not on file  Stress: Not on file  Social Connections: Not on file  Intimate Partner Violence: Not on file    History reviewed. No pertinent surgical history.  History reviewed. No pertinent family history.  No Known Allergies     Latest Ref Rng & Units 03/02/2020    4:44 PM 05/12/2016   11:18 AM  CBC  WBC 4.0 - 10.5 K/uL 8.7   6.4    Hemoglobin 12.0 - 15.0 g/dL 14.2   12.0    Hematocrit 36.0 - 46.0 % 42.0   37.6    Platelets 150 - 400 K/uL 239   231        CMP     Component Value Date/Time   NA 139 03/02/2020 1644   NA 139 07/10/2018 1414   K 3.1 (L) 03/02/2020 1644   CL 101 03/02/2020 1644   CO2 24 03/02/2020 1644   GLUCOSE 87 03/02/2020 1644   BUN 6 03/02/2020 1644   BUN 10 07/10/2018 1414   CREATININE 0.72 03/02/2020 1644   CALCIUM 8.8 (L) 03/02/2020 1644   PROT 6.6 07/10/2018 1414   ALBUMIN 4.3 07/10/2018 1414   AST 22 07/10/2018 1414   ALT 32 05/12/2016 1118   ALKPHOS 88 07/10/2018 1414   BILITOT <0.2 07/10/2018 1414    GFRNONAA >60 03/02/2020 1644   GFRAA >60 03/02/2020 1644     No results found.     Assessment & Plan:   1. Leg swelling Recommend:  I have had a long discussion with the patient regarding swelling and why it  causes symptoms.  Patient will begin wearing graduated compression on a daily basis a prescription was given. The patient will  wear the stockings first thing in the morning and removing them in the evening. The patient is instructed specifically not to sleep in the stockings.   In addition, behavioral modification will be initiated.  This will include frequent elevation, use of over the counter pain medications and exercise such as walking.  Consideration for a lymph pump will also be made based upon the effectiveness of conservative therapy.  This would help to improve the edema control and prevent sequela such as ulcers and infections   Patient should undergo duplex ultrasound of the venous system to ensure that DVT or reflux is not present.  The patient will follow-up with me after the ultrasound.    2. Neurogenic pain The patient does have pain that is concerning for possible claudication.  However I suspect is more neurogenic in nature but the patient has never had arterial studies done.  We will have the patient undergo arterial studies to determine if there is a vascular component to her pain.  3. Spondylosis without myelopathy or radiculopathy, lumbosacral region I suspect this is the cause of the patient's ongoing lower extremity pain.   Current Outpatient Medications on File Prior to Visit  Medication Sig Dispense Refill   albuterol (PROVENTIL) (2.5 MG/3ML) 0.083% nebulizer solution Take 2.5 mg by nebulization every 4 (four) hours as needed.     Ascorbic Acid (VITAMIN C) 1000 MG tablet Take 1,000 mg by mouth daily.     cetirizine (ZYRTEC) 10 MG tablet Take by mouth.     clonazePAM (KLONOPIN) 0.5 MG tablet Take 0.5 mg by mouth 3 (three) times daily.  cyclobenzaprine (FLEXERIL) 10 MG tablet Take 10 mg by mouth at bedtime.     DULoxetine (CYMBALTA) 20 MG capsule Take 20 mg by mouth 2 (two) times daily.     fexofenadine (ALLEGRA) 180 MG tablet Take by mouth.     fluticasone (FLONASE) 50 MCG/ACT nasal spray Place into the nose.     hydrochlorothiazide (HYDRODIURIL) 12.5 MG tablet Take 12.5 mg by mouth daily.     ibuprofen (ADVIL) 600 MG tablet SMARTSIG:1 Tablet(s) By Mouth Every 12 Hours PRN     lisinopril (PRINIVIL,ZESTRIL) 10 MG tablet Take 10 mg by mouth daily.     naproxen (NAPROSYN) 500 MG tablet Take 1 tablet (500 mg total) by mouth 2 (two) times daily with a meal. 60 tablet 0   nortriptyline (PAMELOR) 10 MG capsule Take 10 mg by mouth at bedtime.     ondansetron (ZOFRAN) 4 MG tablet Take 1 tablet (4 mg total) by mouth daily as needed for nausea or vomiting. 20 tablet 1   sertraline (ZOLOFT) 50 MG tablet Take 50 mg by mouth daily.     No current facility-administered medications on file prior to visit.    There are no Patient Instructions on file for this visit. No follow-ups on file.   Kris Hartmann, NP

## 2021-12-10 ENCOUNTER — Other Ambulatory Visit (INDEPENDENT_AMBULATORY_CARE_PROVIDER_SITE_OTHER): Payer: Self-pay | Admitting: Nurse Practitioner

## 2021-12-10 DIAGNOSIS — R609 Edema, unspecified: Secondary | ICD-10-CM

## 2021-12-10 DIAGNOSIS — M792 Neuralgia and neuritis, unspecified: Secondary | ICD-10-CM

## 2021-12-16 ENCOUNTER — Ambulatory Visit (INDEPENDENT_AMBULATORY_CARE_PROVIDER_SITE_OTHER): Payer: Medicare Other

## 2021-12-16 ENCOUNTER — Ambulatory Visit (INDEPENDENT_AMBULATORY_CARE_PROVIDER_SITE_OTHER): Payer: Medicare Other | Admitting: Nurse Practitioner

## 2021-12-16 ENCOUNTER — Encounter (INDEPENDENT_AMBULATORY_CARE_PROVIDER_SITE_OTHER): Payer: Self-pay | Admitting: Nurse Practitioner

## 2021-12-16 VITALS — BP 138/93 | HR 77 | Resp 18 | Ht 62.0 in | Wt 185.0 lb

## 2021-12-16 DIAGNOSIS — R609 Edema, unspecified: Secondary | ICD-10-CM

## 2021-12-16 DIAGNOSIS — I890 Lymphedema, not elsewhere classified: Secondary | ICD-10-CM

## 2021-12-16 DIAGNOSIS — M792 Neuralgia and neuritis, unspecified: Secondary | ICD-10-CM | POA: Diagnosis not present

## 2022-01-02 ENCOUNTER — Encounter (INDEPENDENT_AMBULATORY_CARE_PROVIDER_SITE_OTHER): Payer: Self-pay | Admitting: Nurse Practitioner

## 2022-01-02 NOTE — Progress Notes (Signed)
Subjective:    Patient ID: Dawn Frank, female    DOB: Feb 04, 1986, 36 y.o.   MRN: [id number] No chief complaint on file.   Dawn Frank is a 36 year old female who presents today as a referral from her neurologist Dr. Malvin Johns in regards to lower extremity edema.  Patient has a history of multiple musculoskeletal issues including lower back issues.  She also has noted cerebral palsy.  The patient notes that her legs hurt.  She has a throbbing pain that is constant.  She notes that it is worse when she walks.  She also has swelling of her feet and ankles.  The swelling does decrease if she elevates her legs.  The swelling has been ongoing for approximately a year or so.  She has tried compression but it has not been helpful for her.  The patient has risk factors of possible peripheral arterial disease including smoking.  She denies any rest pain or open wounds or ulcerations.  Today noninvasive study showed no evidence of DVT or superficial thrombophlebitis bilaterally.  No evidence of deep venous insufficiency or superficial venous reflux bilaterally.  Today there is a right ABI of 1.13 and a left of 1.11 with triphasic tibial artery waveforms bilaterally with normal toe waveforms bilaterally.    Review of Systems  Cardiovascular:  Positive for leg swelling.  Musculoskeletal:  Positive for arthralgias, back pain and joint swelling.  All other systems reviewed and are negative.      Objective:   Physical Exam Vitals reviewed.  HENT:     Head: Normocephalic.  Cardiovascular:     Rate and Rhythm: Normal rate.  Pulmonary:     Effort: Pulmonary effort is normal.  Musculoskeletal:     Right lower leg: Edema present.     Left lower leg: Edema present.  Skin:    General: Skin is warm and dry.  Neurological:     Mental Status: She is alert and oriented to person, place, and time.  Psychiatric:        Mood and Affect: Mood normal.        Behavior: Behavior normal.         Thought Content: Thought content normal.        Judgment: Judgment normal.     BP (!) 138/93 (BP Location: Right Arm)   Pulse 77   Resp 18   Ht 5\' 2"  (1.575 m)   Wt 185 lb (83.9 kg)   BMI 33.84 kg/m   Past Medical History:  Diagnosis Date   Cerebral palsy (HCC)    Chronic back pain    Hypertension     Social History   Socioeconomic History   Marital status: Single    Spouse name: Not on file   Number of children: Not on file   Years of education: Not on file   Highest education level: Not on file  Occupational History   Not on file  Tobacco Use   Smoking status: Some Days    Packs/day: 0.50    Years: 6.00    Total pack years: 3.00    Types: Cigarettes   Smokeless tobacco: Never  Substance and Sexual Activity   Alcohol use: No   Drug use: No   Sexual activity: Yes  Other Topics Concern   Not on file  Social History Narrative   Not on file   Social Determinants of Health   Financial Resource Strain: Not on file  Food Insecurity: Not on file  Transportation  Needs: Not on file  Physical Activity: Not on file  Stress: Not on file  Social Connections: Not on file  Intimate Partner Violence: Not on file    History reviewed. No pertinent surgical history.  History reviewed. No pertinent family history.  No Known Allergies     Latest Ref Rng & Units 03/02/2020    4:44 PM 05/12/2016   11:18 AM  CBC  WBC 4.0 - 10.5 K/uL 8.7  6.4   Hemoglobin 12.0 - 15.0 g/dL 40.9  81.1   Hematocrit 36.0 - 46.0 % 42.0  37.6   Platelets 150 - 400 K/uL 239  231       CMP     Component Value Date/Time   NA 139 03/02/2020 1644   NA 139 07/10/2018 1414   K 3.1 (L) 03/02/2020 1644   CL 101 03/02/2020 1644   CO2 24 03/02/2020 1644   GLUCOSE 87 03/02/2020 1644   BUN 6 03/02/2020 1644   BUN 10 07/10/2018 1414   CREATININE 0.72 03/02/2020 1644   CALCIUM 8.8 (L) 03/02/2020 1644   PROT 6.6 07/10/2018 1414   ALBUMIN 4.3 07/10/2018 1414   AST 22 07/10/2018 1414   ALT  32 05/12/2016 1118   ALKPHOS 88 07/10/2018 1414   BILITOT <0.2 07/10/2018 1414   GFRNONAA >60 03/02/2020 1644   GFRAA >60 03/02/2020 1644     VAS [place] ABI WITH/WO TBI  Result Date: 12/20/2021  LOWER EXTREMITY DOPPLER STUDY Patient Name:  Dawn Frank  Date of Exam:   12/16/2021 Medical Rec #: [id number]          Accession #:    [id number] Date of Birth: 12-13-1985          Patient Gender: F Patient Age:   58 years Exam Location:  [facility] Procedure:      VAS [place] ABI WITH/WO TBI Referring Phys: Levora Dredge --------------------------------------------------------------------------------  Indications: Rest pain.  Performing Technologist: Debbe Bales RVS  Examination Guidelines: A complete evaluation includes at minimum, Doppler waveform signals and systolic blood pressure reading at the level of bilateral brachial, anterior tibial, and posterior tibial arteries, when vessel segments are accessible. Bilateral testing is considered an integral part of a complete examination. Photoelectric Plethysmograph (PPG) waveforms and toe systolic pressure readings are included as required and additional duplex testing as needed. Limited examinations for reoccurring indications may be performed as noted.  ABI Findings: +---------+------------------+-----+---------+--------+ Right    Rt Pressure (mmHg)IndexWaveform Comment  +---------+------------------+-----+---------+--------+ Brachial 147                                      +---------+------------------+-----+---------+--------+ ATA      171               1.12 triphasic         +---------+------------------+-----+---------+--------+ PTA      155               1.02 triphasic         +---------+------------------+-----+---------+--------+ Great Toe126               0.83 Normal            +---------+------------------+-----+---------+--------+ +---------+------------------+-----+---------+-------+ Left     Lt Pressure  (mmHg)IndexWaveform Comment +---------+------------------+-----+---------+-------+ Brachial 152                                     +---------+------------------+-----+---------+-------+  ATA      169               1.11 triphasic        +---------+------------------+-----+---------+-------+ PTA      162               1.07 triphasic        +---------+------------------+-----+---------+-------+ Great Toe165               1.09 Normal           +---------+------------------+-----+---------+-------+ +-------+-----------+-----------+------------+------------+ ABI/TBIToday's ABIToday's TBIPrevious ABIPrevious TBI +-------+-----------+-----------+------------+------------+ Right  1.13       .83                                 +-------+-----------+-----------+------------+------------+ Left   1.11       1.09                                +-------+-----------+-----------+------------+------------+  Summary: Right: Resting right ankle-brachial index is within normal range. No evidence of significant right lower extremity arterial disease. The right toe-brachial index is normal. Left: Resting left ankle-brachial index is within normal range. No evidence of significant left lower extremity arterial disease. The left toe-brachial index is normal.  *See table(s) above for measurements and observations.  Electronically signed by Levora Dredge MD on 12/20/2021 at 5:17:55 PM.    Final        Assessment & Plan:   1. Lymphedema Recommend:  I have had a long discussion with the patient regarding swelling and why it  causes symptoms.  Patient will begin wearing graduated compression on a daily basis a prescription was given. The patient will  wear the stockings first thing in the morning and removing them in the evening. The patient is instructed specifically not to sleep in the stockings.   In addition, behavioral modification will be initiated.  This will include frequent  elevation, use of over the counter pain medications and exercise such as walking.  Consideration for a lymph pump will also be made based upon the effectiveness of conservative therapy.  This would help to improve the edema control and prevent sequela such as ulcers and infections   Patient will follow-up in 6 months.  2. Neurogenic pain The patient's lower extremity pain is not related to arterial issues.  Likely related to her ongoing neurogenic pain as well as multiple musculoskeletal issues.   Current Outpatient Medications on File Prior to Visit  Medication Sig Dispense Refill   albuterol (PROVENTIL) (2.5 MG/3ML) 0.083% nebulizer solution Take 2.5 mg by nebulization every 4 (four) hours as needed.     ARIPiprazole (ABILIFY) 5 MG tablet Take 5 mg by mouth daily.     cetirizine (ZYRTEC) 10 MG tablet Take by mouth.     clonazePAM (KLONOPIN) 0.5 MG tablet Take 0.5 mg by mouth 3 (three) times daily.     cyclobenzaprine (FLEXERIL) 10 MG tablet Take 10 mg by mouth at bedtime.     DULoxetine (CYMBALTA) 20 MG capsule Take 20 mg by mouth 2 (two) times daily.     fexofenadine (ALLEGRA) 180 MG tablet Take by mouth.     fluticasone (FLONASE) 50 MCG/ACT nasal spray Place into the nose.     hydrochlorothiazide (HYDRODIURIL) 12.5 MG tablet Take 12.5 mg by mouth daily.     ibuprofen (ADVIL) 600 MG tablet SMARTSIG:1 Tablet(s)  By Mouth Every 12 Hours PRN     lisinopril (PRINIVIL,ZESTRIL) 10 MG tablet Take 10 mg by mouth daily.     meloxicam (MOBIC) 15 MG tablet Take 15 mg by mouth daily.     naproxen (NAPROSYN) 500 MG tablet Take 1 tablet (500 mg total) by mouth 2 (two) times daily with a meal. 60 tablet 0   nortriptyline (PAMELOR) 10 MG capsule Take 10 mg by mouth at bedtime.     Ascorbic Acid (VITAMIN C) 1000 MG tablet Take 1,000 mg by mouth daily. (Patient not taking: Reported on 12/16/2021)     ondansetron (ZOFRAN) 4 MG tablet Take 1 tablet (4 mg total) by mouth daily as needed for nausea or vomiting.  (Patient not taking: Reported on 12/16/2021) 20 tablet 1   sertraline (ZOLOFT) 50 MG tablet Take 50 mg by mouth daily. (Patient not taking: Reported on 12/16/2021)     No current facility-administered medications on file prior to visit.    There are no Patient Instructions on file for this visit. No follow-ups on file.   Georgiana Spinner, NP

## 2022-04-12 ENCOUNTER — Ambulatory Visit
Admission: RE | Admit: 2022-04-12 | Discharge: 2022-04-12 | Disposition: A | Discharge status: Home / Self Care | Payer: Medicare Other | Attending: Family Medicine | Admitting: Family Medicine

## 2022-04-12 ENCOUNTER — Other Ambulatory Visit: Payer: Self-pay | Admitting: Family Medicine

## 2022-04-12 ENCOUNTER — Ambulatory Visit
Admission: RE | Admit: 2022-04-12 | Discharge: 2022-04-12 | Disposition: A | Discharge status: Home / Self Care | Payer: Medicare Other | Source: Ambulatory Visit | Attending: Family Medicine | Admitting: Family Medicine

## 2022-04-12 DIAGNOSIS — J209 Acute bronchitis, unspecified: Secondary | ICD-10-CM

## 2022-06-19 DIAGNOSIS — I890 Lymphedema, not elsewhere classified: Secondary | ICD-10-CM | POA: Insufficient documentation

## 2022-06-19 NOTE — Progress Notes (Deleted)
MRN : [id number]  Dawn Frank is a 37 y.o. (1985-09-27) female who presents with chief complaint of legs swell.  History of Present Illness:   The patient returns to the office for followup evaluation regarding leg swelling.  The swelling has persisted and the pain associated with swelling continues. There have not been any interval development of a ulcerations or wounds.  Since the previous visit the patient has been wearing graduated compression stockings and has noted little if any improvement in the lymphedema. The patient has been using compression routinely morning until night.  The patient also states elevation during the day and exercise is being done too.  No outpatient medications have been marked as taking for the 06/20/22 encounter (Appointment) with Delana Meyer, Dolores Lory, MD.    Past Medical History:  Diagnosis Date   Cerebral palsy ([place])    Chronic back pain    Hypertension     No past surgical history on file.  Social History Social History   Tobacco Use   Smoking status: Some Days    Packs/day: 0.50    Years: 6.00    Total pack years: 3.00    Types: Cigarettes   Smokeless tobacco: Never  Substance Use Topics   Alcohol use: No   Drug use: No    Family History No family history on file.  No Known Allergies   REVIEW OF SYSTEMS (Negative unless checked)  Constitutional: [] Weight loss  [] Fever  [] Chills Cardiac: [] Chest pain   [] Chest pressure   [] Palpitations   [] Shortness of breath when laying flat   [] Shortness of breath with exertion. Vascular:  [] Pain in legs with walking   [x] Pain in legs with standing  [] History of DVT   [] Phlebitis   [x] Swelling in legs   [] Varicose veins   [] Non-healing ulcers Pulmonary:   [] Uses home oxygen   [] Productive cough   [] Hemoptysis   [] Wheeze  [] COPD   [] Asthma Neurologic:  [] Dizziness   [] Seizures   [] History of stroke   [] History of TIA  [] Aphasia   [] Vissual changes   [] Weakness or numbness in arm    [] Weakness or numbness in leg Musculoskeletal:   [] Joint swelling   [] Joint pain   [] Low back pain Hematologic:  [] Easy bruising  [] Easy bleeding   [] Hypercoagulable state   [] Anemic Gastrointestinal:  [] Diarrhea   [] Vomiting  [] Gastroesophageal reflux/heartburn   [] Difficulty swallowing. Genitourinary:  [] Chronic kidney disease   [] Difficult urination  [] Frequent urination   [] Blood in urine Skin:  [] Rashes   [] Ulcers  Psychological:  [] History of anxiety   []  History of major depression.  Physical Examination  There were no vitals filed for this visit. There is no height or weight on file to calculate BMI. Gen: WD/WN, NAD Head: [place]/AT, No temporalis wasting.  Ear/Nose/Throat: Hearing grossly intact, nares w/o erythema or drainage, pinna without lesions Eyes: PER, EOMI, sclera nonicteric.  Neck: Supple, no gross masses.  No JVD.  Pulmonary:  Good air movement, no audible wheezing, no use of accessory muscles.  Cardiac: RRR, precordium not hyperdynamic. Vascular:  scattered varicosities present bilaterally.  Mild venous stasis changes to the legs bilaterally.  3-4+ soft pitting edema, CEAP [id number]  Vessel Right Left  Radial Palpable Palpable  Gastrointestinal: soft, non-distended. No guarding/no peritoneal signs.  Musculoskeletal: M/S 5/5 throughout.  No deformity.  Neurologic: CN 2-12 intact. Pain and light touch intact in extremities.  Symmetrical.  Speech is fluent. Motor exam as listed above. Psychiatric: Judgment intact, Mood &  affect appropriate for pt's clinical situation. Dermatologic: Venous rashes no ulcers noted.  No changes consistent with cellulitis. Lymph : No lichenification or skin changes of chronic lymphedema.  CBC Lab Results  Component Value Date   WBC 8.7 03/02/2020   HGB 14.2 03/02/2020   HCT 42.0 03/02/2020   MCV 94.0 03/02/2020   PLT 239 03/02/2020    BMET    Component Value Date/Time   NA 139 03/02/2020 1644   NA 139 07/10/2018 1414   K 3.1 (L)  03/02/2020 1644   CL 101 03/02/2020 1644   CO2 24 03/02/2020 1644   GLUCOSE 87 03/02/2020 1644   BUN 6 03/02/2020 1644   BUN 10 07/10/2018 1414   CREATININE 0.72 03/02/2020 1644   CALCIUM 8.8 (L) 03/02/2020 1644   GFRNONAA >60 03/02/2020 1644   GFRAA >60 03/02/2020 1644   CrCl cannot be calculated (Patient's most recent lab result is older than the maximum 21 days allowed.).  COAG No results found for: "INR", "PROTIME"  Radiology No results found.   Assessment/Plan There are no diagnoses linked to this encounter.   Levora Dredge, MD  06/19/2022 11:29 AM

## 2022-06-20 ENCOUNTER — Ambulatory Visit (INDEPENDENT_AMBULATORY_CARE_PROVIDER_SITE_OTHER): Payer: Medicare Other | Admitting: Vascular Surgery

## 2022-06-20 DIAGNOSIS — I890 Lymphedema, not elsewhere classified: Secondary | ICD-10-CM

## 2022-06-20 DIAGNOSIS — M5136 Other intervertebral disc degeneration, lumbar region: Secondary | ICD-10-CM
